# Patient Record
Sex: Female | Born: 1985 | Hispanic: No | Marital: Married | Smoking: Never smoker
Health system: Southern US, Community
[De-identification: ages and names within clinical notes are randomized; demographics above are authoritative.]

## PROBLEM LIST (undated history)

## (undated) ENCOUNTER — Inpatient Hospital Stay (HOSPITAL_COMMUNITY): Payer: Self-pay

## (undated) DIAGNOSIS — R51 Headache: Secondary | ICD-10-CM

## (undated) HISTORY — PX: NO PAST SURGERIES: SHX2092

---

## 2010-12-09 ENCOUNTER — Encounter
Admission: RE | Admit: 2010-12-09 | Discharge: 2010-12-09 | Payer: Self-pay | Source: Home / Self Care | Attending: Family Medicine | Admitting: Family Medicine

## 2010-12-28 ENCOUNTER — Encounter: Payer: Self-pay | Admitting: Family Medicine

## 2012-11-27 NOTE — L&D Delivery Note (Signed)
Delivery Note Pt progressed very rapidly w/ stronge urge to push, and after very brief 2nd stage at 12:49 AM a viable female was delivered via Vaginal, Spontaneous Delivery (Presentation: LOA ) delivered through loose nuchal and body cord.  APGAR: 7, 9; weight: not available at time of note  .   Placenta status: Intact, Spontaneous, marginal cord insertion, small, to Pathology.  Cord: 3 vessels with the following complications: None.  Cord pH: not done  Anesthesia: Epidural  Episiotomy: None Lacerations: 2nd degree;Perineal Suture Repair: 3.0 monocryl Est. Blood Loss (mL): 350  Mom to postpartum.  Baby to nursery-stable. Breastfeeding, nexplanon for contraception, OP circumcision  Marge Duncans 06/01/2013, 1:51 AM

## 2012-12-19 ENCOUNTER — Encounter (HOSPITAL_COMMUNITY): Payer: Self-pay

## 2012-12-19 ENCOUNTER — Inpatient Hospital Stay (HOSPITAL_COMMUNITY)
Admission: AD | Admit: 2012-12-19 | Discharge: 2012-12-19 | Disposition: A | Discharge status: Home / Self Care | Payer: Medicaid Other | Source: Ambulatory Visit | Attending: Obstetrics & Gynecology | Admitting: Obstetrics & Gynecology

## 2012-12-19 ENCOUNTER — Inpatient Hospital Stay (HOSPITAL_COMMUNITY): Payer: Medicaid Other

## 2012-12-19 DIAGNOSIS — O209 Hemorrhage in early pregnancy, unspecified: Secondary | ICD-10-CM | POA: Insufficient documentation

## 2012-12-19 DIAGNOSIS — O468X9 Other antepartum hemorrhage, unspecified trimester: Secondary | ICD-10-CM

## 2012-12-19 DIAGNOSIS — N390 Urinary tract infection, site not specified: Secondary | ICD-10-CM | POA: Insufficient documentation

## 2012-12-19 DIAGNOSIS — O418X90 Other specified disorders of amniotic fluid and membranes, unspecified trimester, not applicable or unspecified: Secondary | ICD-10-CM

## 2012-12-19 DIAGNOSIS — R109 Unspecified abdominal pain: Secondary | ICD-10-CM | POA: Insufficient documentation

## 2012-12-19 DIAGNOSIS — O2390 Unspecified genitourinary tract infection in pregnancy, unspecified trimester: Secondary | ICD-10-CM | POA: Insufficient documentation

## 2012-12-19 HISTORY — DX: Headache: R51

## 2012-12-19 LAB — URINE MICROSCOPIC-ADD ON

## 2012-12-19 LAB — WET PREP, GENITAL: Trich, Wet Prep: NONE SEEN

## 2012-12-19 LAB — URINALYSIS, ROUTINE W REFLEX MICROSCOPIC
Bilirubin Urine: NEGATIVE
Protein, ur: 100 mg/dL — AB
Specific Gravity, Urine: >1.03 — ABNORMAL HIGH (ref 1.005–1.030)
Urobilinogen, UA: 0.2 mg/dL (ref 0.0–1.0)

## 2012-12-19 MED ORDER — ACETAMINOPHEN 325 MG PO TABS
650.0000 mg | ORAL_TABLET | Freq: Once | ORAL | Status: AC
  Administered 2012-12-19: 650 mg via ORAL
  Filled 2012-12-19: qty 2

## 2012-12-19 NOTE — MAU Note (Signed)
Patient states she had sudden onset of abdominal pain with light spotting about 40 minutes ago. Slight nausea.

## 2012-12-19 NOTE — MAU Provider Note (Signed)
History     CSN: [id number]  Arrival date and time: 12/19/12 1606   First Provider Initiated Contact with Patient 12/19/12 1808      Chief Complaint  Patient presents with  . Abdominal Pain  . Vaginal Discharge   HPI Ms. Andrea [name] is a 27 y.o. G1P0 at [redacted]w[redacted]d who presents to MAU today with complaint of sudden onset abdominal pain and vaginal bleeding. The patient states that this started around 3:30 pm today. She has not taken any medication for the pain. She rates it at 7/10 now. The patient states a moderate amount of vaginal bleeding similar to a period. She denies N/V/D, fever, abnormal discharge or LOF. The patient has not had prenatal care and plans to start with [facility] when her Medicaid is approved. The patient denies urinary symptoms. LMP approximately 08/24/12.   OB History    Grav Para Term Preterm Abortions TAB SAB Ect Mult Living   1               Past Medical History  Diagnosis Date  . Headache     Past Surgical History  Procedure Date  . No past surgeries     History reviewed. No pertinent family history.  History  Substance Use Topics  . Smoking status: Never Smoker   . Smokeless tobacco: Not on file  . Alcohol Use: No    Allergies: No Known Allergies  No prescriptions prior to admission    ROS All negative unless otherwise noted in HPI Physical Exam   Blood pressure 111/71, pulse 85, temperature 98.1 F (36.7 C), temperature source Oral, resp. rate 18, height 4' 10.5" (1.486 m), weight 157 lb (71.215 kg), last menstrual period 08/24/2012, SpO2 100.00%.  Physical Exam  Constitutional: [name] oriented to person, place, and time. She appears well-developed and well-nourished. No distress.  HENT:  Head: Normocephalic and atraumatic.  Cardiovascular: Normal rate, regular rhythm and normal heart sounds.   Respiratory: Effort normal and breath sounds normal. No respiratory distress.  GI: Soft. Bowel sounds are normal. She exhibits no  distension and no mass. There is tenderness (moderate diffuse tenderness to palpation). There is no rebound and no guarding.  Genitourinary: Vagina normal and uterus normal.       Limited exam secondary to patient discomfort. Moderate amount of blood noted at the introitus. Unable to fully insert the speculum. Wet prep obtained. Unable to obtained GC/Chlamydia due to inability to view cervical os. Significant discomfort with bimanual exam. Patient was very nervous and unable to tolerate full exam.   Neurological: [name] alert and oriented to person, place, and time.  Skin: Skin is warm and dry. No erythema.  Psychiatric: She has a normal mood and affect.   Results for orders placed during the hospital encounter of 12/19/12 (from the past 24 hour(s))  URINALYSIS, ROUTINE W REFLEX MICROSCOPIC     Status: Abnormal   Collection Time   12/19/12  4:40 PM      Component Value Range   Color, Urine RED (*) YELLOW   APPearance TURBID (*) CLEAR   Specific Gravity, Urine >1.030 (*) 1.005 - 1.030   pH 5.5  5.0 - 8.0   Glucose, UA NEGATIVE  NEGATIVE mg/dL   Hgb urine dipstick LARGE (*) NEGATIVE   Bilirubin Urine NEGATIVE  NEGATIVE   Ketones, ur NEGATIVE  NEGATIVE mg/dL   Protein, ur 528 (*) NEGATIVE mg/dL   Urobilinogen, UA 0.2  0.0 - 1.0 mg/dL   Nitrite  POSITIVE (*) NEGATIVE   Leukocytes, UA TRACE (*) NEGATIVE  URINE MICROSCOPIC-ADD ON     Status: Abnormal   Collection Time   12/19/12  4:40 PM      Component Value Range   Squamous Epithelial / LPF RARE  RARE   WBC, UA 11-20  <3 WBC/hpf   RBC / HPF TOO NUMEROUS TO COUNT  <3 RBC/hpf   Bacteria, UA FEW (*) RARE   Crystals CA OXALATE CRYSTALS (*) NEGATIVE  WET PREP, GENITAL     Status: Abnormal   Collection Time   12/19/12  6:35 PM      Component Value Range   Yeast Wet Prep HPF POC NONE SEEN  NONE SEEN   Trich, Wet Prep NONE SEEN  NONE SEEN   Clue Cells Wet Prep HPF POC NONE SEEN  NONE SEEN   WBC, Wet Prep HPF POC FEW (*) NONE SEEN   Radiology  called with verbal results. There was no official read prior to patient's discharge.  Per Dr. Gilford Rile with [facility] radiology: There is a ~ 3.5 cm irregular collection of blood at the anterior portion of the placenta. He feels that this represents a small subchorionic hemorrhage.  Cervix is closed MAU Course  Procedures None  MDM Discussed patient with Dr. Debroah Loop. He feels that since patient's pain is well-controlled at this time we can discharge home with bleeding precautions. Tylenol for pain, pelvic rest and starting prenatal care as soon as possible.   Assessment and Plan  A: Subchorionic hemorrhage UTI in pregnancy  P: Discharge home Rx for Keflex sent to patient's pharmacy. Patient had been discharge when results were reviewed. Will attempt to contact patient by certified letter as contact number given tonight is not in service.  Bleeding precautions discussed.  Patient encouraged to take tylenol for pain Patient encouraged to returned MAU if her bleeding is heavier, pain is uncontrolled by tylenol or she develops N/V or fever.  Patient was encouraged to start prenatal care as soon as possible.    Freddi Starr, PA-C 12/19/2012, 9:35 PM

## 2012-12-20 ENCOUNTER — Encounter (HOSPITAL_COMMUNITY): Payer: Self-pay | Admitting: *Deleted

## 2012-12-21 LAB — URINE CULTURE
Colony Count: NO GROWTH
Culture: NO GROWTH

## 2012-12-23 ENCOUNTER — Other Ambulatory Visit: Payer: Self-pay | Admitting: Advanced Practice Midwife

## 2012-12-25 ENCOUNTER — Telehealth: Payer: Self-pay | Admitting: *Deleted

## 2012-12-25 DIAGNOSIS — N390 Urinary tract infection, site not specified: Secondary | ICD-10-CM

## 2012-12-25 MED ORDER — CEPHALEXIN 500 MG PO CAPS: 500.0000 mg | ORAL_CAPSULE | Freq: Four times a day (QID) | ORAL | Status: DC

## 2012-12-25 NOTE — Telephone Encounter (Signed)
Patient husband called and stated that her received a letter stating that we need to go over lab results. Upon review of her chart I saw that Raynelle Fanning had made a note to order Keflex for a UTI. I spoke with Dr. Macon Large and she gave a verbal order for Keflex 500mg  four times a day x7days. Pt agrees and will pick up medicine at [facility].

## 2013-01-07 ENCOUNTER — Ambulatory Visit (HOSPITAL_COMMUNITY)
Admission: RE | Admit: 2013-01-07 | Discharge: 2013-01-07 | Disposition: A | Discharge status: Home / Self Care | Payer: Medicaid Other | Source: Ambulatory Visit | Attending: Obstetrics & Gynecology | Admitting: Obstetrics & Gynecology

## 2013-01-07 ENCOUNTER — Other Ambulatory Visit: Payer: Self-pay | Admitting: Obstetrics & Gynecology

## 2013-01-07 ENCOUNTER — Ambulatory Visit (INDEPENDENT_AMBULATORY_CARE_PROVIDER_SITE_OTHER): Payer: Self-pay | Admitting: Obstetrics & Gynecology

## 2013-01-07 ENCOUNTER — Encounter: Payer: Self-pay | Admitting: Obstetrics & Gynecology

## 2013-01-07 VITALS — BP 120/73 | Wt 159.9 lb

## 2013-01-07 DIAGNOSIS — Z3480 Encounter for supervision of other normal pregnancy, unspecified trimester: Secondary | ICD-10-CM

## 2013-01-07 DIAGNOSIS — N390 Urinary tract infection, site not specified: Secondary | ICD-10-CM

## 2013-01-07 DIAGNOSIS — O358XX0 Maternal care for other (suspected) fetal abnormality and damage, not applicable or unspecified: Secondary | ICD-10-CM | POA: Insufficient documentation

## 2013-01-07 DIAGNOSIS — O0930 Supervision of pregnancy with insufficient antenatal care, unspecified trimester: Secondary | ICD-10-CM | POA: Insufficient documentation

## 2013-01-07 DIAGNOSIS — N760 Acute vaginitis: Secondary | ICD-10-CM

## 2013-01-07 DIAGNOSIS — Z1389 Encounter for screening for other disorder: Secondary | ICD-10-CM | POA: Insufficient documentation

## 2013-01-07 DIAGNOSIS — O2340 Unspecified infection of urinary tract in pregnancy, unspecified trimester: Secondary | ICD-10-CM

## 2013-01-07 DIAGNOSIS — O2390 Unspecified genitourinary tract infection in pregnancy, unspecified trimester: Secondary | ICD-10-CM

## 2013-01-07 DIAGNOSIS — O23599 Infection of other part of genital tract in pregnancy, unspecified trimester: Secondary | ICD-10-CM

## 2013-01-07 DIAGNOSIS — Z363 Encounter for antenatal screening for malformations: Secondary | ICD-10-CM | POA: Insufficient documentation

## 2013-01-07 MED ORDER — FLUCONAZOLE 150 MG PO TABS: 150.0000 mg | ORAL_TABLET | Freq: Once | ORAL | Status: DC

## 2013-01-07 NOTE — Progress Notes (Signed)
Pulse: 94 Pt complains of headaches.

## 2013-01-07 NOTE — Progress Notes (Signed)
   Subjective:    Andrea [name] is a G1P0 [redacted]w[redacted]d being seen today for her first obstetrical visit.  Her obstetrical history is significant for vaginismus. Patient does not intend to breast feed. Pregnancy history fully reviewed.  Patient reports vaginal irritation.  Filed Vitals:   01/07/13 0914  BP: 120/73  Weight: 159 lb 14.4 oz (72.53 kg)    HISTORY: OB History   Grav Para Term Preterm Abortions TAB SAB Ect Mult Living   1              # Outc Date GA Lbr Len/2nd Wgt Sex Del Anes PTL Lv   1 CUR              Past Medical History  Diagnosis Date  . Headache   . Medical history non-contributory    Past Surgical History  Procedure Laterality Date  . No past surgeries     History reviewed. No pertinent family history.   Exam    Uterus:     Pelvic Exam:    Perineum: No Hemorrhoids   Vulva: normal   Vagina:  curdlike discharge; SEVER ANXIETY related to pelvic exam        Cervix: no cervical motion tenderness, nulliparous appearance and unsure if there will be endocervical cells on PAP. Had to pull speculum.    Adnexa: normal adnexa   Bony Pelvis: average  System: Breast:  Normal to palpation without dominant masses, very tender diffusely   Skin: normal coloration and turgor, no rashes    Neurologic: oriented, normal mood   Extremities: normal strength, tone, and muscle mass           Neck no masses   Cardiovascular: regular rate and rhythm   Respiratory:  appears well, vitals normal, no respiratory distress, acyanotic, normal RR, ear and throat exam is normal, neck free of mass or lymphadenopathy, chest clear, no wheezing, crepitations, rhonchi, normal symmetric air entry   Abdomen: soft, non-tender; bowel sounds normal; no masses,  no organomegaly and gravid          Assessment:    Pregnancy: G1P0 at 19week New OB visit Yeast vaginitis (recently had ATBX for UTI) Vaginismus (etiology unknown)     Plan:     Initial labs drawn. Prenatal  vitamins. Problem list reviewed and updated. Genetic Screening discussed First Screen: requested.  Ultrasound discussed; fetal survey: requested.  Follow up in 4 weeks. 60% of 50 min visit spent on counseling and coordination of care.  Diflucan 150mg  po q day x 1 and repeat in 3 days     HARRAWAY-SMITH, Akeylah Hendel 01/07/2013

## 2013-01-07 NOTE — Patient Instructions (Addendum)
Pregnancy - Second Trimester The second trimester of pregnancy (3 to 6 months) is a period of rapid growth for you and your baby. At the end of the sixth month, your baby is about 9 inches long and weighs 1 1/2 pounds. You will begin to feel the baby move between 18 and 20 weeks of the pregnancy. This is called quickening. Weight gain is faster. A clear fluid (colostrum) may leak out of your breasts. You may feel small contractions of the womb (uterus). This is known as false labor or Braxton-Hicks contractions. This is like a practice for labor when the baby is ready to be born. Usually, the problems with morning sickness have usually passed by the end of your first trimester. Some women develop small dark blotches (called cholasma, mask of pregnancy) on their face that usually goes away after the baby is born. Exposure to the sun makes the blotches worse. Acne may also develop in some pregnant women and pregnant women who have acne, may find that it goes away. PRENATAL EXAMS  Blood work may continue to be done during prenatal exams. These tests are done to check on your health and the probable health of your baby. Blood work is used to follow your blood levels (hemoglobin). Anemia (low hemoglobin) is common during pregnancy. Iron and vitamins are given to help prevent this. You will also be checked for diabetes between 24 and 28 weeks of the pregnancy. Some of the previous blood tests may be repeated.  The size of the uterus is measured during each visit. This is to make sure that the baby is continuing to grow properly according to the dates of the pregnancy.  Your blood pressure is checked every prenatal visit. This is to make sure you are not getting toxemia.  Your urine is checked to make sure you do not have an infection, diabetes or protein in the urine.  Your weight is checked often to make sure gains are happening at the suggested rate. This is to ensure that both you and your baby are growing  normally.  Sometimes, an ultrasound is performed to confirm the proper growth and development of the baby. This is a test which bounces harmless sound waves off the baby so your caregiver can more accurately determine due dates. Sometimes, a specialized test is done on the amniotic fluid surrounding the baby. This test is called an amniocentesis. The amniotic fluid is obtained by sticking a needle into the belly (abdomen). This is done to check the chromosomes in instances where there is a concern about possible genetic problems with the baby. It is also sometimes done near the end of pregnancy if an early delivery is required. In this case, it is done to help make sure the baby's lungs are mature enough for the baby to live outside of the womb. CHANGES OCCURING IN THE SECOND TRIMESTER OF PREGNANCY Your body goes through many changes during pregnancy. They vary from person to person. Talk to your caregiver about changes you notice that you are concerned about.  During the second trimester, you will likely have an increase in your appetite. It is normal to have cravings for certain foods. This varies from person to person and pregnancy to pregnancy.  Your lower abdomen will begin to bulge.  You may have to urinate more often because the uterus and baby are pressing on your bladder. It is also common to get more bladder infections during pregnancy (pain with urination). You can help this by   drinking lots of fluids and emptying your bladder before and after intercourse.  You may begin to get stretch marks on your hips, abdomen, and breasts. These are normal changes in the body during pregnancy. There are no exercises or medications to take that prevent this change.  You may begin to develop swollen and bulging veins (varicose veins) in your legs. Wearing support hose, elevating your feet for 15 minutes, 3 to 4 times a day and limiting salt in your diet helps lessen the problem.  Heartburn may develop  as the uterus grows and pushes up against the stomach. Antacids recommended by your caregiver helps with this problem. Also, eating smaller meals 4 to 5 times a day helps.  Constipation can be treated with a stool softener or adding bulk to your diet. Drinking lots of fluids, vegetables, fruits, and whole grains are helpful.  Exercising is also helpful. If you have been very active up until your pregnancy, most of these activities can be continued during your pregnancy. If you have been less active, it is helpful to start an exercise program such as walking.  Hemorrhoids (varicose veins in the rectum) may develop at the end of the second trimester. Warm sitz baths and hemorrhoid cream recommended by your caregiver helps hemorrhoid problems.  Backaches may develop during this time of your pregnancy. Avoid heavy lifting, wear low heal shoes and practice good posture to help with backache problems.  Some pregnant women develop tingling and numbness of their hand and fingers because of swelling and tightening of ligaments in the wrist (carpel tunnel syndrome). This goes away after the baby is born.  As your breasts enlarge, you may have to get a bigger bra. Get a comfortable, cotton, support bra. Do not get a nursing bra until the last month of the pregnancy if you will be nursing the baby.  You may get a dark line from your belly button to the pubic area called the linea nigra.  You may develop rosy cheeks because of increase blood flow to the face.  You may develop spider looking lines of the face, neck, arms and chest. These go away after the baby is born. HOME CARE INSTRUCTIONS   It is extremely important to avoid all smoking, herbs, alcohol, and unprescribed drugs during your pregnancy. These chemicals affect the formation and growth of the baby. Avoid these chemicals throughout the pregnancy to ensure the delivery of a healthy infant.  Most of your home care instructions are the same as  suggested for the first trimester of your pregnancy. Keep your caregiver's appointments. Follow your caregiver's instructions regarding medication use, exercise and diet.  During pregnancy, you are providing food for you and your baby. Continue to eat regular, well-balanced meals. Choose foods such as meat, fish, milk and other low fat dairy products, vegetables, fruits, and whole-grain breads and cereals. Your caregiver will tell you of the ideal weight gain.  A physical sexual relationship may be continued up until near the end of pregnancy if there are no other problems. Problems could include early (premature) leaking of amniotic fluid from the membranes, vaginal bleeding, abdominal pain, or other medical or pregnancy problems.  Exercise regularly if there are no restrictions. Check with your caregiver if you are unsure of the safety of some of your exercises. The greatest weight gain will occur in the last 2 trimesters of pregnancy. Exercise will help you:  Control your weight.  Get you in shape for labor and delivery.  Lose weight   after you have the baby.  Wear a good support or jogging bra for breast tenderness during pregnancy. This may help if worn during sleep. Pads or tissues may be used in the bra if you are leaking colostrum.  Do not use hot tubs, steam rooms or saunas throughout the pregnancy.  Wear your seat belt at all times when driving. This protects you and your baby if you are in an accident.  Avoid raw meat, uncooked cheese, cat litter boxes and soil used by cats. These carry germs that can cause birth defects in the baby.  The second trimester is also a good time to visit your dentist for your dental health if this has not been done yet. Getting your teeth cleaned is OK. Use a soft toothbrush. Brush gently during pregnancy.  It is easier to loose urine during pregnancy. Tightening up and strengthening the pelvic muscles will help with this problem. Practice stopping your  urination while you are going to the bathroom. These are the same muscles you need to strengthen. It is also the muscles you would use as if you were trying to stop from passing gas. You can practice tightening these muscles up 10 times a set and repeating this about 3 times per day. Once you know what muscles to tighten up, do not perform these exercises during urination. It is more likely to contribute to an infection by backing up the urine.  Ask for help if you have financial, counseling or nutritional needs during pregnancy. Your caregiver will be able to offer counseling for these needs as well as refer you for other special needs.  Your skin may become oily. If so, wash your face with mild soap, use non-greasy moisturizer and oil or cream based makeup. MEDICATIONS AND DRUG USE IN PREGNANCY  Take prenatal vitamins as directed. The vitamin should contain 1 milligram of folic acid. Keep all vitamins out of reach of children. Only a couple vitamins or tablets containing iron may be fatal to a baby or young child when ingested.  Avoid use of all medications, including herbs, over-the-counter medications, not prescribed or suggested by your caregiver. Only take over-the-counter or prescription medicines for pain, discomfort, or fever as directed by your caregiver. Do not use aspirin.  Let your caregiver also know about herbs you may be using.  Alcohol is related to a number of birth defects. This includes fetal alcohol syndrome. All alcohol, in any form, should be avoided completely. Smoking will cause low birth rate and premature babies.  Street or illegal drugs are very harmful to the baby. They are absolutely forbidden. A baby born to an addicted mother will be addicted at birth. The baby will go through the same withdrawal an adult does. SEEK MEDICAL CARE IF:  You have any concerns or worries during your pregnancy. It is better to call with your questions if you feel they cannot wait, rather  than worry about them. SEEK IMMEDIATE MEDICAL CARE IF:   An unexplained oral temperature above 102 F (38.9 C) develops, or as your caregiver suggests.  You have leaking of fluid from the vagina (birth canal). If leaking membranes are suspected, take your temperature and tell your caregiver of this when you call.  There is vaginal spotting, bleeding, or passing clots. Tell your caregiver of the amount and how many pads are used. Light spotting in pregnancy is common, especially following intercourse.  You develop a bad smelling vaginal discharge with a change in the color from clear   to white.  You continue to feel sick to your stomach (nauseated) and have no relief from remedies suggested. You vomit blood or coffee ground-like materials.  You lose more than 2 pounds of weight or gain more than 2 pounds of weight over 1 week, or as suggested by your caregiver.  You notice swelling of your face, hands, feet, or legs.  You get exposed to [place] measles and have never had them.  You are exposed to fifth disease or chickenpox.  You develop belly (abdominal) pain. Round ligament discomfort is a common non-cancerous (benign) cause of abdominal pain in pregnancy. Your caregiver still must evaluate you.  You develop a bad headache that does not go away.  You develop fever, diarrhea, pain with urination, or shortness of breath.  You develop visual problems, blurry, or double vision.  You fall or are in a car accident or any kind of trauma.  There is mental or physical violence at home. Document Released: 11/07/2001 Document Revised: 02/05/2012 Document Reviewed: 05/12/2009 [facility] [employer] [facility] Patient Information 2013 [place], [place]. Monilial Vaginitis Vaginitis in a soreness, swelling and redness (inflammation) of the vagina and vulva. Monilial vaginitis is not a sexually transmitted infection. CAUSES  Yeast vaginitis is caused by yeast (candida) that is normally found in your vagina. With a yeast  infection, the candida has overgrown in number to a point that upsets the chemical balance. SYMPTOMS   White, thick vaginal discharge.  Swelling, itching, redness and irritation of the vagina and possibly the lips of the vagina (vulva).  Burning or painful urination.  Painful intercourse. DIAGNOSIS  Things that may contribute to monilial vaginitis are:  Postmenopausal and virginal states.  Pregnancy.  Infections.  Being tired, sick or stressed, especially if you had monilial vaginitis in the past.  Diabetes. Good control will help lower the chance.  Birth control pills.  Tight fitting garments.  Using bubble bath, feminine sprays, douches or deodorant tampons.  Taking certain medications that kill germs (antibiotics).  Sporadic recurrence can occur if you become ill. TREATMENT  Your caregiver will give you medication.  There are several kinds of anti monilial vaginal creams and suppositories specific for monilial vaginitis. For recurrent yeast infections, use a suppository or cream in the vagina 2 times a week, or as directed.  Anti-monilial or steroid cream for the itching or irritation of the vulva may also be used. Get your caregiver's permission.  Painting the vagina with methylene blue solution may help if the monilial cream does not work.  Eating yogurt may help prevent monilial vaginitis. HOME CARE INSTRUCTIONS   Finish all medication as prescribed.  Do not have sex until treatment is completed or after your caregiver tells you it is okay.  Take warm sitz baths.  Do not douche.  Do not use tampons, especially scented ones.  Wear cotton underwear.  Avoid tight pants and panty hose.  Tell your sexual partner that you have a yeast infection. They should go to their caregiver if they have symptoms such as mild rash or itching.  Your sexual partner should be treated as well if your infection is difficult to eliminate.  Practice safer sex. Use  condoms.  Some vaginal medications cause latex condoms to fail. Vaginal medications that harm condoms are:  Cleocin cream.  Butoconazole (Femstat).  Terconazole (Terazol) vaginal suppository.  Miconazole (Monistat) (may be purchased over the counter). SEEK MEDICAL CARE IF:   You have a temperature by mouth above 102 F (38.9 C).  The infection is getting  worse after 2 days of treatment.  The infection is not getting better after 3 days of treatment.  You develop blisters in or around your vagina.  You develop vaginal bleeding, and it is not your menstrual period.  You have pain when you urinate.  You develop intestinal problems.  You have pain with sexual intercourse. Document Released: 08/23/2005 Document Revised: 02/05/2012 Document Reviewed: 05/07/2009 [facility] Patient Information 2013 [place], [place].

## 2013-01-08 DIAGNOSIS — O43109 Malformation of placenta, unspecified, unspecified trimester: Secondary | ICD-10-CM | POA: Insufficient documentation

## 2013-01-08 LAB — OBSTETRIC PANEL
Hepatitis B Surface Ag: NEGATIVE
Lymphocytes Relative: 34 % (ref 12–46)
Lymphs Abs: 1.6 10*3/uL (ref 0.7–4.0)
MCV: 84.2 fL (ref 78.0–100.0)
Neutro Abs: 2.7 10*3/uL (ref 1.7–7.7)
Neutrophils Relative %: 56 % (ref 43–77)
Platelets: 214 10*3/uL (ref 150–400)
RBC: 4.38 MIL/uL (ref 3.87–5.11)
Rubella: 2.28 Index — ABNORMAL HIGH (ref <0.90)
WBC: 4.7 10*3/uL (ref 4.0–10.5)

## 2013-01-09 LAB — CULTURE, OB URINE

## 2013-01-10 LAB — HEMOGLOBINOPATHY EVALUATION
Hemoglobin Other: 0 %
Hgb F Quant: 0 % (ref 0.0–2.0)
Hgb S Quant: 0 %

## 2013-01-22 ENCOUNTER — Encounter: Payer: Self-pay | Admitting: *Deleted

## 2013-02-04 ENCOUNTER — Ambulatory Visit (INDEPENDENT_AMBULATORY_CARE_PROVIDER_SITE_OTHER): Payer: Medicaid Other | Admitting: Obstetrics & Gynecology

## 2013-02-04 ENCOUNTER — Other Ambulatory Visit: Payer: Self-pay | Admitting: Obstetrics & Gynecology

## 2013-02-04 VITALS — BP 120/74 | Temp 97.1°F | Wt 163.5 lb

## 2013-02-04 DIAGNOSIS — Z3481 Encounter for supervision of other normal pregnancy, first trimester: Secondary | ICD-10-CM

## 2013-02-04 DIAGNOSIS — Z3480 Encounter for supervision of other normal pregnancy, unspecified trimester: Secondary | ICD-10-CM

## 2013-02-04 LAB — POCT URINALYSIS DIP (DEVICE)
Glucose, UA: NEGATIVE mg/dL
Nitrite: NEGATIVE
Protein, ur: NEGATIVE mg/dL
Urobilinogen, UA: 0.2 mg/dL (ref 0.0–1.0)

## 2013-02-04 NOTE — Progress Notes (Signed)
Pulse- 94 

## 2013-02-04 NOTE — Patient Instructions (Addendum)
Pregnancy - Second Trimester The second trimester of pregnancy (3 to 6 months) is a period of rapid growth for you and your baby. At the end of the sixth month, your baby is about 9 inches long and weighs 1 1/2 pounds. You will begin to feel the baby move between 18 and 20 weeks of the pregnancy. This is called quickening. Weight gain is faster. A clear fluid (colostrum) may leak out of your breasts. You may feel small contractions of the womb (uterus). This is known as false labor or Braxton-Hicks contractions. This is like a practice for labor when the baby is ready to be born. Usually, the problems with morning sickness have usually passed by the end of your first trimester. Some women develop small dark blotches (called cholasma, mask of pregnancy) on their face that usually goes away after the baby is born. Exposure to the sun makes the blotches worse. Acne may also develop in some pregnant women and pregnant women who have acne, may find that it goes away. PRENATAL EXAMS  Blood work may continue to be done during prenatal exams. These tests are done to check on your health and the probable health of your baby. Blood work is used to follow your blood levels (hemoglobin). Anemia (low hemoglobin) is common during pregnancy. Iron and vitamins are given to help prevent this. You will also be checked for diabetes between 24 and 28 weeks of the pregnancy. Some of the previous blood tests may be repeated.  The size of the uterus is measured during each visit. This is to make sure that the baby is continuing to grow properly according to the dates of the pregnancy.  Your blood pressure is checked every prenatal visit. This is to make sure you are not getting toxemia.  Your urine is checked to make sure you do not have an infection, diabetes or protein in the urine.  Your weight is checked often to make sure gains are happening at the suggested rate. This is to ensure that both you and your baby are growing  normally.  Sometimes, an ultrasound is performed to confirm the proper growth and development of the baby. This is a test which bounces harmless sound waves off the baby so your caregiver can more accurately determine due dates. Sometimes, a specialized test is done on the amniotic fluid surrounding the baby. This test is called an amniocentesis. The amniotic fluid is obtained by sticking a needle into the belly (abdomen). This is done to check the chromosomes in instances where there is a concern about possible genetic problems with the baby. It is also sometimes done near the end of pregnancy if an early delivery is required. In this case, it is done to help make sure the baby's lungs are mature enough for the baby to live outside of the womb. CHANGES OCCURING IN THE SECOND TRIMESTER OF PREGNANCY Your body goes through many changes during pregnancy. They vary from person to person. Talk to your caregiver about changes you notice that you are concerned about.  During the second trimester, you will likely have an increase in your appetite. It is normal to have cravings for certain foods. This varies from person to person and pregnancy to pregnancy.  Your lower abdomen will begin to bulge.  You may have to urinate more often because the uterus and baby are pressing on your bladder. It is also common to get more bladder infections during pregnancy (pain with urination). You can help this by   drinking lots of fluids and emptying your bladder before and after intercourse.  You may begin to get stretch marks on your hips, abdomen, and breasts. These are normal changes in the body during pregnancy. There are no exercises or medications to take that prevent this change.  You may begin to develop swollen and bulging veins (varicose veins) in your legs. Wearing support hose, elevating your feet for 15 minutes, 3 to 4 times a day and limiting salt in your diet helps lessen the problem.  Heartburn may develop  as the uterus grows and pushes up against the stomach. Antacids recommended by your caregiver helps with this problem. Also, eating smaller meals 4 to 5 times a day helps.  Constipation can be treated with a stool softener or adding bulk to your diet. Drinking lots of fluids, vegetables, fruits, and whole grains are helpful.  Exercising is also helpful. If you have been very active up until your pregnancy, most of these activities can be continued during your pregnancy. If you have been less active, it is helpful to start an exercise program such as walking.  Hemorrhoids (varicose veins in the rectum) may develop at the end of the second trimester. Warm sitz baths and hemorrhoid cream recommended by your caregiver helps hemorrhoid problems.  Backaches may develop during this time of your pregnancy. Avoid heavy lifting, wear low heal shoes and practice good posture to help with backache problems.  Some pregnant women develop tingling and numbness of their hand and fingers because of swelling and tightening of ligaments in the wrist (carpel tunnel syndrome). This goes away after the baby is born.  As your breasts enlarge, you may have to get a bigger bra. Get a comfortable, cotton, support bra. Do not get a nursing bra until the last month of the pregnancy if you will be nursing the baby.  You may get a dark line from your belly button to the pubic area called the linea nigra.  You may develop rosy cheeks because of increase blood flow to the face.  You may develop spider looking lines of the face, neck, arms and chest. These go away after the baby is born. HOME CARE INSTRUCTIONS   It is extremely important to avoid all smoking, herbs, alcohol, and unprescribed drugs during your pregnancy. These chemicals affect the formation and growth of the baby. Avoid these chemicals throughout the pregnancy to ensure the delivery of a healthy infant.  Most of your home care instructions are the same as  suggested for the first trimester of your pregnancy. Keep your caregiver's appointments. Follow your caregiver's instructions regarding medication use, exercise and diet.  During pregnancy, you are providing food for you and your baby. Continue to eat regular, well-balanced meals. Choose foods such as meat, fish, milk and other low fat dairy products, vegetables, fruits, and whole-grain breads and cereals. Your caregiver will tell you of the ideal weight gain.  A physical sexual relationship may be continued up until near the end of pregnancy if there are no other problems. Problems could include early (premature) leaking of amniotic fluid from the membranes, vaginal bleeding, abdominal pain, or other medical or pregnancy problems.  Exercise regularly if there are no restrictions. Check with your caregiver if you are unsure of the safety of some of your exercises. The greatest weight gain will occur in the last 2 trimesters of pregnancy. Exercise will help you:  Control your weight.  Get you in shape for labor and delivery.  Lose weight   after you have the baby.  Wear a good support or jogging bra for breast tenderness during pregnancy. This may help if worn during sleep. Pads or tissues may be used in the bra if you are leaking colostrum.  Do not use hot tubs, steam rooms or saunas throughout the pregnancy.  Wear your seat belt at all times when driving. This protects you and your baby if you are in an accident.  Avoid raw meat, uncooked cheese, cat litter boxes and soil used by cats. These carry germs that can cause birth defects in the baby.  The second trimester is also a good time to visit your dentist for your dental health if this has not been done yet. Getting your teeth cleaned is OK. Use a soft toothbrush. Brush gently during pregnancy.  It is easier to loose urine during pregnancy. Tightening up and strengthening the pelvic muscles will help with this problem. Practice stopping your  urination while you are going to the bathroom. These are the same muscles you need to strengthen. It is also the muscles you would use as if you were trying to stop from passing gas. You can practice tightening these muscles up 10 times a set and repeating this about 3 times per day. Once you know what muscles to tighten up, do not perform these exercises during urination. It is more likely to contribute to an infection by backing up the urine.  Ask for help if you have financial, counseling or nutritional needs during pregnancy. Your caregiver will be able to offer counseling for these needs as well as refer you for other special needs.  Your skin may become oily. If so, wash your face with mild soap, use non-greasy moisturizer and oil or cream based makeup. MEDICATIONS AND DRUG USE IN PREGNANCY  Take prenatal vitamins as directed. The vitamin should contain 1 milligram of folic acid. Keep all vitamins out of reach of children. Only a couple vitamins or tablets containing iron may be fatal to a baby or young child when ingested.  Avoid use of all medications, including herbs, over-the-counter medications, not prescribed or suggested by your caregiver. Only take over-the-counter or prescription medicines for pain, discomfort, or fever as directed by your caregiver. Do not use aspirin.  Let your caregiver also know about herbs you may be using.  Alcohol is related to a number of birth defects. This includes fetal alcohol syndrome. All alcohol, in any form, should be avoided completely. Smoking will cause low birth rate and premature babies.  Street or illegal drugs are very harmful to the baby. They are absolutely forbidden. A baby born to an addicted mother will be addicted at birth. The baby will go through the same withdrawal an adult does. SEEK MEDICAL CARE IF:  You have any concerns or worries during your pregnancy. It is better to call with your questions if you feel they cannot wait, rather  than worry about them. SEEK IMMEDIATE MEDICAL CARE IF:   An unexplained oral temperature above 102 F (38.9 C) develops, or as your caregiver suggests.  You have leaking of fluid from the vagina (birth canal). If leaking membranes are suspected, take your temperature and tell your caregiver of this when you call.  There is vaginal spotting, bleeding, or passing clots. Tell your caregiver of the amount and how many pads are used. Light spotting in pregnancy is common, especially following intercourse.  You develop a bad smelling vaginal discharge with a change in the color from clear   to white.  You continue to feel sick to your stomach (nauseated) and have no relief from remedies suggested. You vomit blood or coffee ground-like materials.  You lose more than 2 pounds of weight or gain more than 2 pounds of weight over 1 week, or as suggested by your caregiver.  You notice swelling of your face, hands, feet, or legs.  You get exposed to German measles and have never had them.  You are exposed to fifth disease or chickenpox.  You develop belly (abdominal) pain. Round ligament discomfort is a common non-cancerous (benign) cause of abdominal pain in pregnancy. Your caregiver still must evaluate you.  You develop a bad headache that does not go away.  You develop fever, diarrhea, pain with urination, or shortness of breath.  You develop visual problems, blurry, or double vision.  You fall or are in a car accident or any kind of trauma.  There is mental or physical violence at home. Document Released: 11/07/2001 Document Revised: 02/05/2012 Document Reviewed: 05/12/2009 [employer] Patient Information 2013 [employer].  

## 2013-02-04 NOTE — Addendum Note (Signed)
Addended by: Sherre Lain A on: 02/04/2013 12:50 PM   Modules accepted: Orders

## 2013-02-04 NOTE — Progress Notes (Signed)
Pt with no complaints.  Reviewed prenatal labs.

## 2013-02-06 LAB — CULTURE, OB URINE: Colony Count: >=100000

## 2013-03-04 ENCOUNTER — Other Ambulatory Visit: Payer: Self-pay | Admitting: Obstetrics & Gynecology

## 2013-03-04 ENCOUNTER — Ambulatory Visit (INDEPENDENT_AMBULATORY_CARE_PROVIDER_SITE_OTHER): Payer: Medicaid Other | Admitting: Obstetrics & Gynecology

## 2013-03-04 VITALS — BP 111/76 | Temp 97.8°F | Wt 169.1 lb

## 2013-03-04 DIAGNOSIS — IMO0002 Reserved for concepts with insufficient information to code with codable children: Secondary | ICD-10-CM

## 2013-03-04 DIAGNOSIS — Z23 Encounter for immunization: Secondary | ICD-10-CM

## 2013-03-04 DIAGNOSIS — Z3493 Encounter for supervision of normal pregnancy, unspecified, third trimester: Secondary | ICD-10-CM

## 2013-03-04 DIAGNOSIS — Z3480 Encounter for supervision of other normal pregnancy, unspecified trimester: Secondary | ICD-10-CM

## 2013-03-04 DIAGNOSIS — IMO0001 Reserved for inherently not codable concepts without codable children: Secondary | ICD-10-CM

## 2013-03-04 LAB — CBC
Hemoglobin: 12 g/dL (ref 12.0–15.0)
MCH: 29.2 pg (ref 26.0–34.0)
MCHC: 33.2 g/dL (ref 30.0–36.0)
MCV: 87.8 fL (ref 78.0–100.0)
RBC: 4.11 MIL/uL (ref 3.87–5.11)

## 2013-03-04 LAB — POCT URINALYSIS DIP (DEVICE)
Ketones, ur: NEGATIVE mg/dL
Nitrite: NEGATIVE
Protein, ur: NEGATIVE mg/dL
pH: 6 (ref 5.0–8.0)

## 2013-03-04 MED ORDER — TETANUS-DIPHTH-ACELL PERTUSSIS 5-2.5-18.5 LF-MCG/0.5 IM SUSP
0.5000 mL | Freq: Once | INTRAMUSCULAR | Status: AC
  Administered 2013-03-04: 0.5 mL via INTRAMUSCULAR

## 2013-03-04 NOTE — Progress Notes (Addendum)
Discussed marginal cord insertion, need for followup ultrasound after 28 weeks.  1 hr GTT and third trimester labs today.  Consented for TDaP vaccine, will receive today.  No other complaints or concerns.  Fetal movement and labor precautions reviewed.

## 2013-03-04 NOTE — Progress Notes (Signed)
Pulse-  99  Pain/pressure-  "when baby is moving"

## 2013-03-04 NOTE — Patient Instructions (Signed)
Return to clinic for any obstetric concerns or go to [facility] for evaluation  

## 2013-03-05 LAB — RPR

## 2013-03-06 ENCOUNTER — Encounter: Payer: Self-pay | Admitting: Obstetrics & Gynecology

## 2013-03-25 ENCOUNTER — Other Ambulatory Visit: Payer: Self-pay | Admitting: Obstetrics & Gynecology

## 2013-03-25 ENCOUNTER — Encounter: Payer: Self-pay | Admitting: Obstetrics & Gynecology

## 2013-03-25 ENCOUNTER — Ambulatory Visit (INDEPENDENT_AMBULATORY_CARE_PROVIDER_SITE_OTHER): Payer: Medicaid Other | Admitting: Obstetrics & Gynecology

## 2013-03-25 VITALS — BP 112/78 | Temp 97.1°F | Wt 172.6 lb

## 2013-03-25 DIAGNOSIS — O43899 Other placental disorders, unspecified trimester: Secondary | ICD-10-CM

## 2013-03-25 DIAGNOSIS — O43103 Malformation of placenta, unspecified, third trimester: Secondary | ICD-10-CM

## 2013-03-25 LAB — POCT URINALYSIS DIP (DEVICE)
Ketones, ur: NEGATIVE mg/dL
Protein, ur: NEGATIVE mg/dL
Specific Gravity, Urine: 1.015 (ref 1.005–1.030)
Urobilinogen, UA: 0.2 mg/dL (ref 0.0–1.0)
pH: 6.5 (ref 5.0–8.0)

## 2013-03-25 NOTE — Progress Notes (Signed)
Patient reports having a hard time breathing when she lays down.

## 2013-03-25 NOTE — Patient Instructions (Addendum)
Contraceptive Implant Information A contraceptive implant is a plastic rod that is inserted under the skin. It is usually inserted under the skin of your upper arm. It continually releases small amounts of progestin (synthetic progesterone) into the bloodstream. This prevents an egg from being released from the ovary. It also thickens the cervical mucus to prevent sperm from entering the cervix, and it thins the uterine lining to prevent a fertilized egg from attaching to the uterus. They can be effective for up to 3 years. Implants do not provide protection against sexually transmitted diseases (STDs).  The procedure to insert an implant usually takes about 10 minutes. There may be minor bruising, swelling, and discomfort at the insertion site for a couple days. The implant begins to work within the first day. Other contraceptive protection should be used for 2 weeks. Follow up with your caregiver to get rechecked as directed. Your caregiver will make sure you are a good candidate for the contraceptive implant. Discuss with your caregiver the possible side effects of the implant ADVANTAGES  It prevents pregnancy for up to 3 years.  It is easily reversible.  It is convenient.  The progestins may protect against uterine and ovarian cancer.  It can be used when breastfeeding.  It can be used by women who cannot take estrogen. DISADVANTAGES  You may have irregular or unplanned vaginal bleeding.  You may develop side effects, including headache, weight gain, acne, breast tenderness, or mood changes.  You may have tissue or nerve damage after insertion (rare).  It may be difficult and uncomfortable to remove.  Certain medications may interfere with the effectiveness of the implants. REMOVAL OF IMPLANT The implant should be removed in 3 years or as directed by your caregiver. The implants effect wears off in a few hours after removal. Your ability to get pregnant (fertility) is restored  within a couple of weeks. New implants can be inserted as soon as the old ones are removed if desired. DO NOT GET THE IMPLANT IF:   You are pregnant.  You have a history of breast cancer, osteoporosis, blood clots, heart disease, diabetes, high blood pressure, liver disease, tumors, or stroke.   You have undiagnosed vaginal bleeding.  You have overly sensitive to certain parts of the implant. Document Released: 11/02/2011 Document Revised: 02/05/2012 Document Reviewed: 11/02/2011 [employer] Patient Information 2013 [employer].  

## 2013-03-25 NOTE — Progress Notes (Signed)
Pt with no complaints.  Has marginal cord insertion- f/u sono ordered Desires Nexplanon for contraception F/u 2 weeks

## 2013-03-31 ENCOUNTER — Ambulatory Visit (HOSPITAL_COMMUNITY)
Admission: RE | Admit: 2013-03-31 | Discharge: 2013-03-31 | Disposition: A | Discharge status: Home / Self Care | Payer: Medicaid Other | Source: Ambulatory Visit | Attending: Obstetrics & Gynecology | Admitting: Obstetrics & Gynecology

## 2013-03-31 DIAGNOSIS — O43103 Malformation of placenta, unspecified, third trimester: Secondary | ICD-10-CM

## 2013-03-31 DIAGNOSIS — O0930 Supervision of pregnancy with insufficient antenatal care, unspecified trimester: Secondary | ICD-10-CM | POA: Insufficient documentation

## 2013-03-31 DIAGNOSIS — Z3689 Encounter for other specified antenatal screening: Secondary | ICD-10-CM | POA: Insufficient documentation

## 2013-04-09 ENCOUNTER — Encounter: Payer: Self-pay | Admitting: Advanced Practice Midwife

## 2013-04-09 ENCOUNTER — Ambulatory Visit (INDEPENDENT_AMBULATORY_CARE_PROVIDER_SITE_OTHER): Payer: Medicaid Other | Admitting: Advanced Practice Midwife

## 2013-04-09 VITALS — BP 97/64 | Temp 98.2°F | Wt 176.0 lb

## 2013-04-09 DIAGNOSIS — IMO0001 Reserved for inherently not codable concepts without codable children: Secondary | ICD-10-CM

## 2013-04-09 DIAGNOSIS — Z3493 Encounter for supervision of normal pregnancy, unspecified, third trimester: Secondary | ICD-10-CM

## 2013-04-09 LAB — POCT URINALYSIS DIP (DEVICE)
Nitrite: NEGATIVE
Protein, ur: 30 mg/dL — AB
Specific Gravity, Urine: 1.02 (ref 1.005–1.030)
Urobilinogen, UA: 0.2 mg/dL (ref 0.0–1.0)

## 2013-04-09 NOTE — Progress Notes (Signed)
No complaints today. Feeling well. Scheduled fu [place] for 04/14/13. Reviewed fetal kick counts.

## 2013-04-09 NOTE — Progress Notes (Signed)
P-85 

## 2013-04-14 ENCOUNTER — Other Ambulatory Visit: Payer: Self-pay | Admitting: Advanced Practice Midwife

## 2013-04-14 ENCOUNTER — Ambulatory Visit (HOSPITAL_COMMUNITY)
Admission: RE | Admit: 2013-04-14 | Discharge: 2013-04-14 | Disposition: A | Discharge status: Home / Self Care | Payer: Medicaid Other | Source: Ambulatory Visit | Attending: Advanced Practice Midwife | Admitting: Advanced Practice Midwife

## 2013-04-14 ENCOUNTER — Ambulatory Visit (HOSPITAL_COMMUNITY)
Admission: RE | Admit: 2013-04-14 | Discharge: 2013-04-14 | Disposition: A | Discharge status: Home / Self Care | Payer: Medicaid Other | Source: Ambulatory Visit | Attending: Obstetrics & Gynecology | Admitting: Obstetrics & Gynecology

## 2013-04-14 ENCOUNTER — Encounter: Payer: Self-pay | Admitting: Obstetrics & Gynecology

## 2013-04-14 ENCOUNTER — Other Ambulatory Visit: Payer: Self-pay | Admitting: Obstetrics & Gynecology

## 2013-04-14 DIAGNOSIS — Z3493 Encounter for supervision of normal pregnancy, unspecified, third trimester: Secondary | ICD-10-CM

## 2013-04-14 DIAGNOSIS — O43103 Malformation of placenta, unspecified, third trimester: Secondary | ICD-10-CM

## 2013-04-14 DIAGNOSIS — O365990 Maternal care for other known or suspected poor fetal growth, unspecified trimester, not applicable or unspecified: Secondary | ICD-10-CM | POA: Insufficient documentation

## 2013-04-14 DIAGNOSIS — Z3689 Encounter for other specified antenatal screening: Secondary | ICD-10-CM | POA: Insufficient documentation

## 2013-04-16 ENCOUNTER — Ambulatory Visit (INDEPENDENT_AMBULATORY_CARE_PROVIDER_SITE_OTHER): Payer: Medicaid Other | Admitting: *Deleted

## 2013-04-16 ENCOUNTER — Encounter: Payer: Self-pay | Admitting: *Deleted

## 2013-04-16 VITALS — BP 121/71 | Wt 176.8 lb

## 2013-04-16 DIAGNOSIS — O365931 Maternal care for other known or suspected poor fetal growth, third trimester, fetus 1: Secondary | ICD-10-CM

## 2013-04-16 DIAGNOSIS — O365990 Maternal care for other known or suspected poor fetal growth, unspecified trimester, not applicable or unspecified: Secondary | ICD-10-CM

## 2013-04-16 NOTE — Progress Notes (Signed)
P - 81 

## 2013-04-17 NOTE — Progress Notes (Signed)
NST 04/16/13 reactive

## 2013-04-18 ENCOUNTER — Ambulatory Visit (INDEPENDENT_AMBULATORY_CARE_PROVIDER_SITE_OTHER): Payer: Medicaid Other | Admitting: *Deleted

## 2013-04-18 VITALS — BP 115/63

## 2013-04-18 DIAGNOSIS — O365931 Maternal care for other known or suspected poor fetal growth, third trimester, fetus 1: Secondary | ICD-10-CM

## 2013-04-18 DIAGNOSIS — O365990 Maternal care for other known or suspected poor fetal growth, unspecified trimester, not applicable or unspecified: Secondary | ICD-10-CM

## 2013-04-18 NOTE — Progress Notes (Signed)
P-102 

## 2013-04-20 NOTE — Progress Notes (Signed)
5/23 NST reviewed and reactive 

## 2013-04-23 ENCOUNTER — Ambulatory Visit (INDEPENDENT_AMBULATORY_CARE_PROVIDER_SITE_OTHER): Payer: Medicaid Other | Admitting: Advanced Practice Midwife

## 2013-04-23 VITALS — BP 110/75 | Temp 99.5°F | Wt 177.0 lb

## 2013-04-23 DIAGNOSIS — O365990 Maternal care for other known or suspected poor fetal growth, unspecified trimester, not applicable or unspecified: Secondary | ICD-10-CM

## 2013-04-23 LAB — POCT URINALYSIS DIP (DEVICE)
Glucose, UA: NEGATIVE mg/dL
Nitrite: NEGATIVE
Urobilinogen, UA: 0.2 mg/dL (ref 0.0–1.0)
pH: 7 (ref 5.0–8.0)

## 2013-04-23 NOTE — Patient Instructions (Signed)
Pregnancy - Third Trimester  The third trimester begins at the 28th week of pregnancy and ends at birth. It is important to follow your doctor's instructions.  HOME CARE    Go to your doctor's visits.   Do not smoke.   Do not drink alcohol or use drugs.   Only take medicine as told by your doctor.   Take prenatal vitamins as told. The vitamin should contain 1 milligram of folic acid.   Exercise.   Eat healthy foods. Eat regular, well-balanced meals.   You can have sex (intercourse) if there are no other problems with the pregnancy.   Do not use hot tubs, steam rooms, or saunas.   Wear a seat belt while driving.   Avoid raw meat, uncooked cheese, and litter boxes and soil used by cats.   Rest with your legs raised (elevated).   Make a list of emergency phone numbers. Keep this list with you.   Arrange for help when you come back home after delivering the baby.   Make a trial run to the hospital.   Take prenatal classes.   Prepare the baby's nursery.   Do not travel out of the city. If you absolutely have to, get permission from your doctor first.   Wear flat shoes. Do not wear high heels.  GET HELP RIGHT AWAY IF:    You have a temperature by mouth above 102 F (38.9 C), not controlled by medicine.   You have not felt the baby move for more than 1 hour. If you think the baby is not moving as much as normal, eat something with sugar in it or lie down on your left side for an hour. The baby should move at least 4 to 5 times per hour.   Fluid is coming from the vagina.   Blood is coming from the vagina. Light spotting is common, especially after sex (intercourse).   You have belly (abdominal) pain.   You have a bad smelling fluid (discharge) coming from the vagina. The fluid changes from clear to white.   You still feel sick to your stomach (nauseous).   You throw up (vomit) for more than 24 hours.   You have the chills.   You have shortness of breath.   You have a burning feeling when you  pee (urinate).   You lose or gain more than 2 pounds (0.9 kilograms) of weight over a week, or as told by your doctor.   Your face, hands, feet, or legs get puffy (swell).   You have a bad headache that will not go away.   You start to have problems seeing (blurry or double vision).   You fall, are in a car accident, or have any kind of trauma.   There is mental or physical violence at home.   You have any concerns or worries during your pregnancy.  MAKE SURE YOU:    Understand these instructions.   Will watch your condition.   Will get help right away if you are not doing well or get worse.  Document Released: 02/07/2010 Document Revised: 02/05/2012 Document Reviewed: 02/07/2010  [employer] Patient Information 2014 [employer].

## 2013-04-23 NOTE — Progress Notes (Signed)
Pulse: 102

## 2013-04-23 NOTE — Progress Notes (Signed)
Well, no c/o. Awaiting NST and AFI today. Rev'd precautions.

## 2013-04-25 ENCOUNTER — Ambulatory Visit (INDEPENDENT_AMBULATORY_CARE_PROVIDER_SITE_OTHER): Payer: Medicaid Other | Admitting: *Deleted

## 2013-04-25 VITALS — BP 99/60

## 2013-04-25 DIAGNOSIS — O365931 Maternal care for other known or suspected poor fetal growth, third trimester, fetus 1: Secondary | ICD-10-CM

## 2013-04-25 DIAGNOSIS — O365990 Maternal care for other known or suspected poor fetal growth, unspecified trimester, not applicable or unspecified: Secondary | ICD-10-CM

## 2013-04-25 NOTE — Progress Notes (Signed)
P-103 

## 2013-04-25 NOTE — Progress Notes (Signed)
NST reviewed and reactive.  Alper Guilmette L. Harraway-Smith, M.D., FACOG    

## 2013-04-30 ENCOUNTER — Ambulatory Visit (INDEPENDENT_AMBULATORY_CARE_PROVIDER_SITE_OTHER): Payer: Medicaid Other | Admitting: Advanced Practice Midwife

## 2013-04-30 VITALS — BP 119/72 | Wt 181.1 lb

## 2013-04-30 DIAGNOSIS — O365990 Maternal care for other known or suspected poor fetal growth, unspecified trimester, not applicable or unspecified: Secondary | ICD-10-CM

## 2013-04-30 DIAGNOSIS — O365931 Maternal care for other known or suspected poor fetal growth, third trimester, fetus 1: Secondary | ICD-10-CM

## 2013-04-30 LAB — POCT URINALYSIS DIP (DEVICE)
Nitrite: NEGATIVE
Protein, ur: NEGATIVE mg/dL
Urobilinogen, UA: 0.2 mg/dL (ref 0.0–1.0)
pH: 6.5 (ref 5.0–8.0)

## 2013-04-30 NOTE — Progress Notes (Signed)
P = 99   [place] for growth scheduled next week

## 2013-04-30 NOTE — Progress Notes (Signed)
NST reactive today. Scheduled for ultrasound for next week.

## 2013-05-02 ENCOUNTER — Ambulatory Visit (INDEPENDENT_AMBULATORY_CARE_PROVIDER_SITE_OTHER): Payer: Medicaid Other | Admitting: *Deleted

## 2013-05-02 VITALS — BP 112/69

## 2013-05-02 DIAGNOSIS — O365931 Maternal care for other known or suspected poor fetal growth, third trimester, fetus 1: Secondary | ICD-10-CM

## 2013-05-02 DIAGNOSIS — O365990 Maternal care for other known or suspected poor fetal growth, unspecified trimester, not applicable or unspecified: Secondary | ICD-10-CM

## 2013-05-02 NOTE — Progress Notes (Signed)
P=99 

## 2013-05-02 NOTE — Progress Notes (Signed)
NST reviewed and reactive.  

## 2013-05-07 ENCOUNTER — Ambulatory Visit (INDEPENDENT_AMBULATORY_CARE_PROVIDER_SITE_OTHER): Payer: Medicaid Other | Admitting: Family

## 2013-05-07 ENCOUNTER — Other Ambulatory Visit: Payer: Medicaid Other

## 2013-05-07 ENCOUNTER — Ambulatory Visit (HOSPITAL_COMMUNITY)
Admission: RE | Admit: 2013-05-07 | Discharge: 2013-05-07 | Disposition: A | Discharge status: Home / Self Care | Payer: Medicaid Other | Source: Ambulatory Visit | Attending: Advanced Practice Midwife | Admitting: Advanced Practice Midwife

## 2013-05-07 VITALS — BP 109/71 | Wt 181.3 lb

## 2013-05-07 DIAGNOSIS — O365990 Maternal care for other known or suspected poor fetal growth, unspecified trimester, not applicable or unspecified: Secondary | ICD-10-CM

## 2013-05-07 DIAGNOSIS — O0930 Supervision of pregnancy with insufficient antenatal care, unspecified trimester: Secondary | ICD-10-CM | POA: Insufficient documentation

## 2013-05-07 DIAGNOSIS — O43899 Other placental disorders, unspecified trimester: Secondary | ICD-10-CM

## 2013-05-07 DIAGNOSIS — O4390 Unspecified placental disorder, unspecified trimester: Secondary | ICD-10-CM

## 2013-05-07 DIAGNOSIS — O365931 Maternal care for other known or suspected poor fetal growth, third trimester, fetus 1: Secondary | ICD-10-CM

## 2013-05-07 DIAGNOSIS — Z3493 Encounter for supervision of normal pregnancy, unspecified, third trimester: Secondary | ICD-10-CM

## 2013-05-07 DIAGNOSIS — R8271 Bacteriuria: Secondary | ICD-10-CM

## 2013-05-07 LAB — OB RESULTS CONSOLE GC/CHLAMYDIA
Chlamydia: NEGATIVE
Gonorrhea: NEGATIVE

## 2013-05-07 LAB — POCT URINALYSIS DIP (DEVICE)
Nitrite: NEGATIVE
Protein, ur: 30 mg/dL — AB
Urobilinogen, UA: 1 mg/dL (ref 0.0–1.0)

## 2013-05-07 LAB — OB RESULTS CONSOLE GBS: GBS: NEGATIVE

## 2013-05-07 MED ORDER — NITROFURANTOIN MONOHYD MACRO 100 MG PO CAPS: 100.0000 mg | ORAL_CAPSULE | Freq: Two times a day (BID) | ORAL | Status: DC

## 2013-05-07 NOTE — Progress Notes (Signed)
NST reviewed and reactive; irregular contractions; pt with extreme reaction with pelvic exam, denies hx of sexual abuse.  GBS, GC/CT collected.  Large leuks on urine, RX macrobid, urine culture sent.  Growth [place] today at 9:45.  Return for NST on Friday.

## 2013-05-07 NOTE — Progress Notes (Signed)
P = 98   [place] for growth today @ 0945

## 2013-05-07 NOTE — Addendum Note (Signed)
Addended by: Franchot Mimes on: 05/07/2013 10:16 AM   Modules accepted: Orders

## 2013-05-08 LAB — GC/CHLAMYDIA PROBE AMP: CT Probe RNA: NEGATIVE

## 2013-05-09 ENCOUNTER — Ambulatory Visit (INDEPENDENT_AMBULATORY_CARE_PROVIDER_SITE_OTHER): Payer: Medicaid Other | Admitting: *Deleted

## 2013-05-09 ENCOUNTER — Other Ambulatory Visit: Payer: Medicaid Other

## 2013-05-09 DIAGNOSIS — O365990 Maternal care for other known or suspected poor fetal growth, unspecified trimester, not applicable or unspecified: Secondary | ICD-10-CM

## 2013-05-09 DIAGNOSIS — O365931 Maternal care for other known or suspected poor fetal growth, third trimester, fetus 1: Secondary | ICD-10-CM

## 2013-05-10 ENCOUNTER — Encounter: Payer: Self-pay | Admitting: Family

## 2013-05-11 LAB — CULTURE, OB URINE: Colony Count: 80000

## 2013-05-11 NOTE — Progress Notes (Signed)
NST performed today was reviewed and was found to be reactive.  Continue recommended antenatal testing and prenatal care.  

## 2013-05-12 ENCOUNTER — Other Ambulatory Visit: Payer: Self-pay | Admitting: Family

## 2013-05-14 ENCOUNTER — Ambulatory Visit (INDEPENDENT_AMBULATORY_CARE_PROVIDER_SITE_OTHER): Payer: Medicaid Other | Admitting: Advanced Practice Midwife

## 2013-05-14 ENCOUNTER — Encounter: Payer: Self-pay | Admitting: *Deleted

## 2013-05-14 VITALS — BP 117/75 | Wt 182.2 lb

## 2013-05-14 DIAGNOSIS — O365931 Maternal care for other known or suspected poor fetal growth, third trimester, fetus 1: Secondary | ICD-10-CM

## 2013-05-14 DIAGNOSIS — O43899 Other placental disorders, unspecified trimester: Secondary | ICD-10-CM

## 2013-05-14 DIAGNOSIS — O368990 Maternal care for other specified fetal problems, unspecified trimester, not applicable or unspecified: Secondary | ICD-10-CM

## 2013-05-14 DIAGNOSIS — O365990 Maternal care for other known or suspected poor fetal growth, unspecified trimester, not applicable or unspecified: Secondary | ICD-10-CM

## 2013-05-14 DIAGNOSIS — O43103 Malformation of placenta, unspecified, third trimester: Secondary | ICD-10-CM

## 2013-05-14 DIAGNOSIS — O4390 Unspecified placental disorder, unspecified trimester: Secondary | ICD-10-CM

## 2013-05-14 LAB — POCT URINALYSIS DIP (DEVICE)
Bilirubin Urine: NEGATIVE
Ketones, ur: NEGATIVE mg/dL
Specific Gravity, Urine: 1.02 (ref 1.005–1.030)
pH: 6.5 (ref 5.0–8.0)

## 2013-05-14 NOTE — Progress Notes (Signed)
Feeling well. NST reactive. Continue with 2x weekly testing. D/W Particia Nearing, MD (MFM). OK to continue 2X weekly testing. Does not need IOL by 39 weeks, but plan for IOL by 40 weeks as long as testing remains ok. Does not need to have a repeat growth ultrasound prior to then. D/W family will schedule IOL for 05/31/13. Scheduled for 05/31/13 at 7:15pm for cervical ripening.

## 2013-05-14 NOTE — Progress Notes (Signed)
P = 94   Pt reports having some vomiting w/o nausea 3 times last week- seemed to happen just after eating. Advised to pt that she may take Zantac if desired.

## 2013-05-16 ENCOUNTER — Ambulatory Visit (INDEPENDENT_AMBULATORY_CARE_PROVIDER_SITE_OTHER): Payer: Medicaid Other | Admitting: *Deleted

## 2013-05-16 VITALS — BP 114/64 | Temp 98.8°F | Wt 184.3 lb

## 2013-05-16 DIAGNOSIS — O4390 Unspecified placental disorder, unspecified trimester: Secondary | ICD-10-CM

## 2013-05-16 DIAGNOSIS — O365990 Maternal care for other known or suspected poor fetal growth, unspecified trimester, not applicable or unspecified: Secondary | ICD-10-CM

## 2013-05-16 DIAGNOSIS — O365930 Maternal care for other known or suspected poor fetal growth, third trimester, not applicable or unspecified: Secondary | ICD-10-CM

## 2013-05-16 DIAGNOSIS — O43899 Other placental disorders, unspecified trimester: Secondary | ICD-10-CM

## 2013-05-16 NOTE — Progress Notes (Signed)
P=73, here for NSt

## 2013-05-19 ENCOUNTER — Ambulatory Visit (INDEPENDENT_AMBULATORY_CARE_PROVIDER_SITE_OTHER): Payer: Medicaid Other | Admitting: General Practice

## 2013-05-19 VITALS — BP 110/67 | Wt 185.3 lb

## 2013-05-19 DIAGNOSIS — O365931 Maternal care for other known or suspected poor fetal growth, third trimester, fetus 1: Secondary | ICD-10-CM

## 2013-05-19 DIAGNOSIS — O365990 Maternal care for other known or suspected poor fetal growth, unspecified trimester, not applicable or unspecified: Secondary | ICD-10-CM

## 2013-05-19 NOTE — Progress Notes (Signed)
Pulse: 92

## 2013-05-19 NOTE — Progress Notes (Signed)
NST reviewed and reactive.  

## 2013-05-21 ENCOUNTER — Ambulatory Visit (HOSPITAL_COMMUNITY)
Admission: RE | Admit: 2013-05-21 | Discharge: 2013-05-21 | Disposition: A | Discharge status: Home / Self Care | Payer: Medicaid Other | Source: Ambulatory Visit | Attending: Obstetrics & Gynecology | Admitting: Obstetrics & Gynecology

## 2013-05-21 ENCOUNTER — Ambulatory Visit (INDEPENDENT_AMBULATORY_CARE_PROVIDER_SITE_OTHER): Payer: Medicaid Other | Admitting: Family Medicine

## 2013-05-21 VITALS — BP 116/75 | Temp 96.9°F | Wt 186.0 lb

## 2013-05-21 DIAGNOSIS — O365931 Maternal care for other known or suspected poor fetal growth, third trimester, fetus 1: Secondary | ICD-10-CM

## 2013-05-21 DIAGNOSIS — O368990 Maternal care for other specified fetal problems, unspecified trimester, not applicable or unspecified: Secondary | ICD-10-CM

## 2013-05-21 DIAGNOSIS — O365990 Maternal care for other known or suspected poor fetal growth, unspecified trimester, not applicable or unspecified: Secondary | ICD-10-CM

## 2013-05-21 DIAGNOSIS — O43103 Malformation of placenta, unspecified, third trimester: Secondary | ICD-10-CM

## 2013-05-21 DIAGNOSIS — O365930 Maternal care for other known or suspected poor fetal growth, third trimester, not applicable or unspecified: Secondary | ICD-10-CM

## 2013-05-21 DIAGNOSIS — O0930 Supervision of pregnancy with insufficient antenatal care, unspecified trimester: Secondary | ICD-10-CM | POA: Insufficient documentation

## 2013-05-21 LAB — POCT URINALYSIS DIP (DEVICE)
Ketones, ur: NEGATIVE mg/dL
Protein, ur: NEGATIVE mg/dL
Specific Gravity, Urine: 1.015 (ref 1.005–1.030)

## 2013-05-21 NOTE — Progress Notes (Signed)
No complaints. NST today - reviewed, reactive. Scheduled for induction at 40 weeks. Labor precautions discussed.

## 2013-05-21 NOTE — Progress Notes (Signed)
Pulse 83  Edema trace in ankles.

## 2013-05-21 NOTE — Patient Instructions (Signed)
Vaginal Delivery  Your caregiver must first be sure you are in labor. Signs of labor include:   You may pass what is called "the mucus plug" before labor begins. This is a small amount of blood stained mucus.   Regular uterine contractions.   The time between contractions get closer together.   The discomfort and pain gradually gets more intense.   Pains are mostly located in the back.   Pains get worse when walking.   The cervix (the opening of the uterus) becomes thinner (begins to efface) and opens up (dilates).  Once you are in labor and admitted into the hospital or care center, your caregiver will do the following:   A complete physical examination.   Check your vital signs (blood pressure, pulse, temperature and the fetal heart rate).   Do a vaginal examination (using a sterile glove and lubricant) to determine:   The position (presentation) of the baby (head [vertex] or buttock first).   The level (station) of the baby's head in the birth canal.   The effacement and dilatation of the cervix.   You may have your pubic hair shaved and be given an enema depending on your caregiver and the circumstance.   An electronic monitor is usually placed on your abdomen. The monitor follows the length and intensity of the contractions, as well as the baby's heart rate.   Usually, your caregiver will insert an IV in your arm with a bottle of sugar water. This is done as a precaution so that medications can be given to you quickly during labor or delivery.  NORMAL LABOR AND DELIVERY IS DIVIDED UP INTO 3 STAGES:  First Stage  This is when regular contractions begin and the cervix begins to efface and dilate. This stage can last from 3 to 15 hours. The end of the first stage is when the cervix is 100% effaced and 10 centimeters dilated. Pain medications may be given by    Injection (morphine, demerol, etc.)    Regional anesthesia (spinal, caudal or epidural, anesthetics given in different locations of the spine). Paracervical pain medication may be given, which is an injection of and anesthetic on each side of the cervix.  A pregnant woman may request to have "Natural Childbirth" which is not to have any medications or anesthesia during her labor and delivery.  Second Stage  This is when the baby comes down through the birth canal (vagina) and is born. This can take 1 to 4 hours. As the baby's head comes down through the birth canal, you may feel like you are going to have a bowel movement. You will get the urge to bear down and push until the baby is delivered. As the baby's head is being delivered, the caregiver will decide if an episiotomy (a cut in the perineum and vagina area) is needed to prevent tearing of the tissue in this area. The episiotomy is sewn up after the delivery of the baby and placenta. Sometimes a mask with nitrous oxide is given for the mother to breath during the delivery of the baby to help if there is too much pain. The end of Stage 2 is when the baby is fully delivered. Then when the umbilical cord stops pulsating it is clamped and cut.  Third Stage  The third stage begins after the baby is completely delivered and ends after the placenta (afterbirth) is delivered. This usually takes 5 to 30 minutes. After the placenta is delivered, a medication is given   either by intravenous or injection to help contract the uterus and prevent bleeding. The third stage is not painful and pain medication is usually not necessary. If an episiotomy was done, it is repaired at this time.  After the delivery, the mother is watched and monitored closely for 1 to 2 hours to make sure there is no postpartum bleeding (hemorrhage). If there is a lot of bleeding, medication is given to contract the uterus and stop the bleeding.  Document Released: 08/22/2008 Document Revised: 08/07/2012 Document Reviewed: 08/22/2008   [employer] Patient Information 2014 [employer].

## 2013-05-26 ENCOUNTER — Ambulatory Visit (INDEPENDENT_AMBULATORY_CARE_PROVIDER_SITE_OTHER): Payer: Medicaid Other | Admitting: *Deleted

## 2013-05-26 ENCOUNTER — Telehealth (HOSPITAL_COMMUNITY): Payer: Self-pay | Admitting: *Deleted

## 2013-05-26 VITALS — BP 120/70 | Temp 98.3°F | Wt 185.3 lb

## 2013-05-26 DIAGNOSIS — O4390 Unspecified placental disorder, unspecified trimester: Secondary | ICD-10-CM

## 2013-05-26 DIAGNOSIS — O368990 Maternal care for other specified fetal problems, unspecified trimester, not applicable or unspecified: Secondary | ICD-10-CM

## 2013-05-26 DIAGNOSIS — O43899 Other placental disorders, unspecified trimester: Secondary | ICD-10-CM

## 2013-05-26 DIAGNOSIS — O365990 Maternal care for other known or suspected poor fetal growth, unspecified trimester, not applicable or unspecified: Secondary | ICD-10-CM

## 2013-05-26 DIAGNOSIS — O365931 Maternal care for other known or suspected poor fetal growth, third trimester, fetus 1: Secondary | ICD-10-CM

## 2013-05-26 DIAGNOSIS — O43103 Malformation of placenta, unspecified, third trimester: Secondary | ICD-10-CM

## 2013-05-26 NOTE — Telephone Encounter (Signed)
Preadmission screen  

## 2013-05-26 NOTE — Progress Notes (Signed)
P=81,    Here for nst

## 2013-05-27 NOTE — Progress Notes (Addendum)
NST performed today was reviewed and was found to be reactive, had one mild variable decelerations but was reactive afterwards.  Continue recommended antenatal testing and prenatal care.

## 2013-05-27 NOTE — Patient Instructions (Signed)
Return to clinic for any obstetric concerns or go to [facility] for evaluation  

## 2013-05-28 ENCOUNTER — Ambulatory Visit (INDEPENDENT_AMBULATORY_CARE_PROVIDER_SITE_OTHER): Payer: Medicaid Other | Admitting: Family

## 2013-05-28 VITALS — BP 109/76 | Wt 185.8 lb

## 2013-05-28 DIAGNOSIS — O365931 Maternal care for other known or suspected poor fetal growth, third trimester, fetus 1: Secondary | ICD-10-CM

## 2013-05-28 DIAGNOSIS — O365990 Maternal care for other known or suspected poor fetal growth, unspecified trimester, not applicable or unspecified: Secondary | ICD-10-CM

## 2013-05-28 LAB — POCT URINALYSIS DIP (DEVICE)
Ketones, ur: NEGATIVE mg/dL
Protein, ur: 30 mg/dL — AB
Specific Gravity, Urine: 1.02 (ref 1.005–1.030)

## 2013-05-28 MED ORDER — AMPICILLIN 500 MG PO CAPS: 500.0000 mg | ORAL_CAPSULE | Freq: Three times a day (TID) | ORAL | Status: DC

## 2013-05-28 NOTE — Progress Notes (Signed)
Pulse: 81

## 2013-05-28 NOTE — Progress Notes (Signed)
IOL scheduled on 05/31/13 @ 1930

## 2013-05-28 NOTE — Progress Notes (Signed)
No questions or concerns; IOL on 05/31/13 @ 1930;  NST reactive; taking tour of hospital tomorrow night; large leuks in urine, asymptomatic, begin Ampicillin.

## 2013-05-30 NOTE — Progress Notes (Signed)
NST reactive on 05/26/13.  One variable.

## 2013-05-31 ENCOUNTER — Inpatient Hospital Stay (HOSPITAL_COMMUNITY): Payer: Medicaid Other | Admitting: Anesthesiology

## 2013-05-31 ENCOUNTER — Encounter (HOSPITAL_COMMUNITY): Payer: Self-pay | Admitting: Anesthesiology

## 2013-05-31 ENCOUNTER — Encounter (HOSPITAL_COMMUNITY): Payer: Self-pay

## 2013-05-31 ENCOUNTER — Inpatient Hospital Stay (HOSPITAL_COMMUNITY)
Admission: RE | Admit: 2013-05-31 | Discharge: 2013-06-02 | DRG: 775 | Disposition: A | Discharge status: Home / Self Care | Payer: Medicaid Other | Source: Ambulatory Visit | Attending: Obstetrics & Gynecology | Admitting: Obstetrics & Gynecology

## 2013-05-31 VITALS — BP 126/75 | HR 100 | Temp 97.9°F | Resp 18 | Ht 58.5 in | Wt 185.0 lb

## 2013-05-31 DIAGNOSIS — O365990 Maternal care for other known or suspected poor fetal growth, unspecified trimester, not applicable or unspecified: Principal | ICD-10-CM | POA: Diagnosis present

## 2013-05-31 DIAGNOSIS — Z3493 Encounter for supervision of normal pregnancy, unspecified, third trimester: Secondary | ICD-10-CM

## 2013-05-31 DIAGNOSIS — O43103 Malformation of placenta, unspecified, third trimester: Secondary | ICD-10-CM

## 2013-05-31 LAB — TYPE AND SCREEN: ABO/RH(D): O POS

## 2013-05-31 LAB — CBC
MCH: 29.3 pg (ref 26.0–34.0)
MCHC: 34.2 g/dL (ref 30.0–36.0)
Platelets: 201 10*3/uL (ref 150–400)
RBC: 4.57 MIL/uL (ref 3.87–5.11)
RDW: 14.3 % (ref 11.5–15.5)

## 2013-05-31 MED ORDER — FLEET ENEMA 7-19 GM/118ML RE ENEM: 1.0000 | ENEMA | RECTAL | Status: DC | PRN

## 2013-05-31 MED ORDER — IBUPROFEN 600 MG PO TABS: 600.0000 mg | ORAL_TABLET | Freq: Four times a day (QID) | ORAL | Status: DC | PRN

## 2013-05-31 MED ORDER — LACTATED RINGERS IV SOLN: 500.0000 mL | Freq: Once | INTRAVENOUS | Status: DC

## 2013-05-31 MED ORDER — ACETAMINOPHEN 325 MG PO TABS: 650.0000 mg | ORAL_TABLET | ORAL | Status: DC | PRN

## 2013-05-31 MED ORDER — PHENYLEPHRINE 40 MCG/ML (10ML) SYRINGE FOR IV PUSH (FOR BLOOD PRESSURE SUPPORT): 80.0000 ug | PREFILLED_SYRINGE | INTRAVENOUS | Status: DC | PRN

## 2013-05-31 MED ORDER — FENTANYL 2.5 MCG/ML BUPIVACAINE 1/10 % EPIDURAL INFUSION (WH - ANES)
14.0000 mL/h | INTRAMUSCULAR | Status: DC | PRN
  Filled 2013-05-31: qty 125

## 2013-05-31 MED ORDER — FENTANYL 2.5 MCG/ML BUPIVACAINE 1/10 % EPIDURAL INFUSION (WH - ANES)
INTRAMUSCULAR | Status: DC | PRN
  Administered 2013-05-31: 14 mL/h via EPIDURAL

## 2013-05-31 MED ORDER — EPHEDRINE 5 MG/ML INJ
10.0000 mg | INTRAVENOUS | Status: DC | PRN
  Filled 2013-05-31: qty 4

## 2013-05-31 MED ORDER — OXYTOCIN 40 UNITS IN LACTATED RINGERS INFUSION - SIMPLE MED
1.0000 m[IU]/min | INTRAVENOUS | Status: DC
  Administered 2013-05-31: 2 m[IU]/min via INTRAVENOUS
  Filled 2013-05-31: qty 1000

## 2013-05-31 MED ORDER — PHENYLEPHRINE 40 MCG/ML (10ML) SYRINGE FOR IV PUSH (FOR BLOOD PRESSURE SUPPORT)
80.0000 ug | PREFILLED_SYRINGE | INTRAVENOUS | Status: DC | PRN
  Filled 2013-05-31: qty 5

## 2013-05-31 MED ORDER — OXYCODONE-ACETAMINOPHEN 5-325 MG PO TABS
1.0000 | ORAL_TABLET | ORAL | Status: DC | PRN
Start: 2013-05-31 — End: 2013-06-01

## 2013-05-31 MED ORDER — LIDOCAINE HCL (PF) 1 % IJ SOLN
30.0000 mL | INTRAMUSCULAR | Status: DC | PRN
  Filled 2013-05-31: qty 30

## 2013-05-31 MED ORDER — CITRIC ACID-SODIUM CITRATE 334-500 MG/5ML PO SOLN: 30.0000 mL | ORAL | Status: DC | PRN

## 2013-05-31 MED ORDER — EPHEDRINE 5 MG/ML INJ: 10.0000 mg | INTRAVENOUS | Status: DC | PRN

## 2013-05-31 MED ORDER — OXYTOCIN 40 UNITS IN LACTATED RINGERS INFUSION - SIMPLE MED: 62.5000 mL/h | INTRAVENOUS | Status: DC

## 2013-05-31 MED ORDER — DIPHENHYDRAMINE HCL 50 MG/ML IJ SOLN: 12.5000 mg | INTRAMUSCULAR | Status: DC | PRN

## 2013-05-31 MED ORDER — LIDOCAINE HCL (PF) 1 % IJ SOLN
INTRAMUSCULAR | Status: DC | PRN
  Administered 2013-05-31 (×2): 5 mL
  Administered 2013-05-31: 3 mL

## 2013-05-31 MED ORDER — TERBUTALINE SULFATE 1 MG/ML IJ SOLN: 0.2500 mg | Freq: Once | INTRAMUSCULAR | Status: AC | PRN

## 2013-05-31 MED ORDER — OXYTOCIN BOLUS FROM INFUSION: 500.0000 mL | INTRAVENOUS | Status: DC

## 2013-05-31 MED ORDER — ONDANSETRON HCL 4 MG/2ML IJ SOLN: 4.0000 mg | Freq: Four times a day (QID) | INTRAMUSCULAR | Status: DC | PRN

## 2013-05-31 MED ORDER — LACTATED RINGERS IV SOLN
INTRAVENOUS | Status: DC
  Administered 2013-05-31: 20:00:00 via INTRAVENOUS

## 2013-05-31 MED ORDER — HYDROXYZINE HCL 50 MG PO TABS: 50.0000 mg | ORAL_TABLET | Freq: Four times a day (QID) | ORAL | Status: DC | PRN

## 2013-05-31 MED ORDER — LACTATED RINGERS IV SOLN: 500.0000 mL | INTRAVENOUS | Status: DC | PRN

## 2013-05-31 NOTE — Anesthesia Procedure Notes (Signed)
Epidural Patient location during procedure: OB  Staffing Anesthesiologist: Thailyn Khalid Performed by: anesthesiologist   Preanesthetic Checklist Completed: patient identified, site marked, surgical consent, pre-op evaluation, timeout performed, IV checked, risks and benefits discussed and monitors and equipment checked  Epidural Patient position: sitting Prep: ChloraPrep Patient monitoring: heart rate, continuous pulse ox and blood pressure Approach: midline Injection technique: LOR saline  Needle:  Needle type: Tuohy  Needle gauge: 17 G Needle length: 9 cm and 9 Needle insertion depth: 7 cm Catheter type: closed end flexible Catheter size: 20 Guage Catheter at skin depth: 12 cm Test dose: negative  Assessment Events: blood not aspirated, injection not painful, no injection resistance, negative IV test and no paresthesia  Additional Notes   Patient tolerated the insertion well without complications.   

## 2013-05-31 NOTE — Anesthesia Preprocedure Evaluation (Signed)

## 2013-05-31 NOTE — H&P (Signed)
Andrea [name] is a 27 y.o. G1P0 female at [redacted]w[redacted]d by LMP which correlates exactly w/ 16.5wk u/s, presenting for IOL d/t SGA w/ marginal cord insertion. Pt initiated care at [facility] @ 19.3wks, quad screen normal, anatomy scan revealed marginal cord insertion w/ efw 33%, 1hr glucola 113, gbs neg. Repeat u/s: 5/5 @ 31.2wks: efw 27%, 5/19 @ 33.2wks efw 13% w/ normal dopplers, 6/11 @ 36.4wks 16%, afi has remained normal/stable throughout. Has been followed w/ 2x/wk nst's since ~32wks.  Reports uncomfortable uc's today, denies vb or lof. Good fm.   Maternal Medical History:  Reason for admission: IOL d/t marginal cord insertion w/ SGA  Contractions: Onset was 13-24 hours ago.   Frequency: irregular.   Perceived severity is moderate.    Fetal activity: Perceived fetal activity is normal.   Last perceived fetal movement was within the past hour.    Prenatal complications: no prenatal complications Prenatal Complications - Diabetes: none.    OB History   Grav Para Term Preterm Abortions TAB SAB Ect Mult Living   1              Past Medical History  Diagnosis Date  . Headache(784.0)   . Medical history non-contributory    Past Surgical History  Procedure Laterality Date  . No past surgeries     Family History: family history is not on file. Social History:  reports that she has never smoked. She does not have any smokeless tobacco history on file. She reports that she does not drink alcohol or use illicit drugs.   Review of Systems  Constitutional: Negative.   HENT: Negative.   Eyes: Negative.   Respiratory: Negative.   Cardiovascular: Negative.   Gastrointestinal: Positive for abdominal pain (uc's).  Genitourinary: Negative.   Musculoskeletal: Negative.   Skin: Negative.   Neurological: Negative.   Endo/Heme/Allergies: Negative.   Psychiatric/Behavioral: Negative.     Dilation: 4 Effacement (%): 60 Station: -3 Exam by:: k Makesha Belitz rn Blood pressure 121/71, pulse 98,  temperature 98.5 F (36.9 C), temperature source Oral, resp. rate 20, height 4' 10.5" (1.486 m), weight 83.915 kg (185 lb), last menstrual period 08/24/2012. Maternal Exam:  Uterine Assessment: Contraction strength is mild.  Contraction frequency is irregular.   Abdomen: Patient reports no abdominal tenderness. Fetal presentation: vertex  Pelvis: adequate for delivery.   Cervix: Cervix evaluated by digital exam.     Fetal Exam Fetal Monitor Review: Mode: ultrasound.   Baseline rate: 120.  Variability: moderate (6-25 bpm).   Pattern: accelerations present and no decelerations.    Fetal State Assessment: Category I - tracings are normal.     Physical Exam  Constitutional: [name] oriented to person, place, and time. She appears well-developed and well-nourished.  HENT:  Head: Normocephalic.  Neck: Normal range of motion.  Cardiovascular: Normal rate and regular rhythm.   Respiratory: Effort normal and breath sounds normal.  GI: Soft.  gravid  Genitourinary:  SVE: pt very tender on exam, began screaming and crying. Pt/husband states she's been like this entire pregnancy. Cx posterior and to maternal Rt- able to reach w/ one finger but pt would not tolerate both fingers reaching cx. Felt to be app 4/60/-3 w/ bbow   Musculoskeletal: Normal range of motion.  Neurological: [name] alert and oriented to person, place, and time. She has normal reflexes.  Skin: Skin is warm and dry.  Psychiatric: She has a normal mood and affect. Her behavior is normal. Judgment and thought content normal.  Prenatal labs: ABO, Rh: --/--/O POS (07/05 2000) Antibody: NEG (07/05 2000) Rubella: 2.28 (02/11 1049) RPR: NON REAC (04/08 1057)  HBsAg: NEGATIVE (02/11 1049)  HIV: NON REACTIVE (02/11 1049)  GBS: Negative (06/11 0000)  1hr glucola: 113  Assessment/Plan: A:  [redacted]w[redacted]d SIUP  G1P0   IOL d/t SGA w/ marginal cord insertion   Cat I FHR  GBS neg  Extreme reaction w/ SVE, ?vaginismus      P:  Admit to BS  Pitocin per protocol  IV pain meds/epidural prn  AROM when vtx lower  Anticipate NSVD     Marge Duncans 05/31/2013, 11:07 PM

## 2013-06-01 ENCOUNTER — Encounter (HOSPITAL_COMMUNITY): Payer: Self-pay

## 2013-06-01 DIAGNOSIS — O365990 Maternal care for other known or suspected poor fetal growth, unspecified trimester, not applicable or unspecified: Secondary | ICD-10-CM

## 2013-06-01 MED ORDER — SODIUM CHLORIDE 0.9 % IJ SOLN: 3.0000 mL | INTRAMUSCULAR | Status: DC | PRN

## 2013-06-01 MED ORDER — WITCH HAZEL-GLYCERIN EX PADS: 1.0000 "application " | MEDICATED_PAD | CUTANEOUS | Status: DC | PRN

## 2013-06-01 MED ORDER — PRENATAL MULTIVITAMIN CH
1.0000 | ORAL_TABLET | Freq: Every day | ORAL | Status: DC
  Administered 2013-06-01 – 2013-06-02 (×2): 1 via ORAL
  Filled 2013-06-01 (×2): qty 1

## 2013-06-01 MED ORDER — ONDANSETRON HCL 4 MG/2ML IJ SOLN: 4.0000 mg | INTRAMUSCULAR | Status: DC | PRN

## 2013-06-01 MED ORDER — SODIUM CHLORIDE 0.9 % IJ SOLN
3.0000 mL | Freq: Two times a day (BID) | INTRAMUSCULAR | Status: DC
  Administered 2013-06-01: 3 mL via INTRAVENOUS

## 2013-06-01 MED ORDER — BENZOCAINE-MENTHOL 20-0.5 % EX AERO
1.0000 "application " | INHALATION_SPRAY | CUTANEOUS | Status: DC | PRN
  Filled 2013-06-01 (×2): qty 56

## 2013-06-01 MED ORDER — TETANUS-DIPHTH-ACELL PERTUSSIS 5-2.5-18.5 LF-MCG/0.5 IM SUSP: 0.5000 mL | Freq: Once | INTRAMUSCULAR | Status: DC

## 2013-06-01 MED ORDER — DIBUCAINE 1 % RE OINT
1.0000 "application " | TOPICAL_OINTMENT | RECTAL | Status: DC | PRN
  Filled 2013-06-01: qty 28

## 2013-06-01 MED ORDER — MEASLES, MUMPS & RUBELLA VAC ~~LOC~~ INJ
0.5000 mL | INJECTION | Freq: Once | SUBCUTANEOUS | Status: DC
  Filled 2013-06-01: qty 0.5

## 2013-06-01 MED ORDER — OXYCODONE-ACETAMINOPHEN 5-325 MG PO TABS: 1.0000 | ORAL_TABLET | ORAL | Status: DC | PRN

## 2013-06-01 MED ORDER — LANOLIN HYDROUS EX OINT: TOPICAL_OINTMENT | CUTANEOUS | Status: DC | PRN

## 2013-06-01 MED ORDER — AMPICILLIN 500 MG PO CAPS
500.0000 mg | ORAL_CAPSULE | Freq: Three times a day (TID) | ORAL | Status: DC
  Administered 2013-06-01 – 2013-06-02 (×5): 500 mg via ORAL
  Filled 2013-06-01 (×8): qty 1

## 2013-06-01 MED ORDER — IBUPROFEN 600 MG PO TABS
600.0000 mg | ORAL_TABLET | Freq: Four times a day (QID) | ORAL | Status: DC
  Administered 2013-06-01 – 2013-06-02 (×7): 600 mg via ORAL
  Filled 2013-06-01 (×7): qty 1

## 2013-06-01 MED ORDER — ZOLPIDEM TARTRATE 5 MG PO TABS: 5.0000 mg | ORAL_TABLET | Freq: Every evening | ORAL | Status: DC | PRN

## 2013-06-01 MED ORDER — SENNOSIDES-DOCUSATE SODIUM 8.6-50 MG PO TABS: 2.0000 | ORAL_TABLET | Freq: Every day | ORAL | Status: DC

## 2013-06-01 MED ORDER — SIMETHICONE 80 MG PO CHEW: 80.0000 mg | CHEWABLE_TABLET | ORAL | Status: DC | PRN

## 2013-06-01 MED ORDER — ONDANSETRON HCL 4 MG PO TABS: 4.0000 mg | ORAL_TABLET | ORAL | Status: DC | PRN

## 2013-06-01 MED ORDER — SODIUM CHLORIDE 0.9 % IV SOLN: 250.0000 mL | INTRAVENOUS | Status: DC | PRN

## 2013-06-01 MED ORDER — OXYTOCIN 40 UNITS IN LACTATED RINGERS INFUSION - SIMPLE MED: 62.5000 mL/h | INTRAVENOUS | Status: DC | PRN

## 2013-06-01 MED ORDER — BISACODYL 10 MG RE SUPP
10.0000 mg | Freq: Every day | RECTAL | Status: DC | PRN
  Filled 2013-06-01: qty 1

## 2013-06-01 MED ORDER — FLEET ENEMA 7-19 GM/118ML RE ENEM: 1.0000 | ENEMA | Freq: Every day | RECTAL | Status: DC | PRN

## 2013-06-01 MED ORDER — DIPHENHYDRAMINE HCL 25 MG PO CAPS: 25.0000 mg | ORAL_CAPSULE | Freq: Four times a day (QID) | ORAL | Status: DC | PRN

## 2013-06-01 NOTE — Anesthesia Postprocedure Evaluation (Addendum)
  Anesthesia Post-op Note  Patient: Andrea [name]  Procedure(s) Performed: * No procedures listed *  Patient Location: women's unit  Anesthesia Type:Epidural  Level of Consciousness: awake and alert   Airway and Oxygen Therapy: Patient Spontanous Breathing  Post-op Pain: mild  Post-op Assessment: Patient's Cardiovascular Status Stable, Respiratory Function Stable, No signs of Nausea or vomiting, Pain level controlled, No headache, No residual numbness and No residual motor weakness  Post-op Vital Signs: Reviewed and stable  Complications: No apparent anesthesia complications

## 2013-06-02 MED ORDER — IBUPROFEN 600 MG PO TABS: 600.0000 mg | ORAL_TABLET | Freq: Four times a day (QID) | ORAL | Status: DC | PRN

## 2013-06-02 NOTE — Lactation Note (Signed)
This note was copied from the chart of Andrea [name]. Lactation Consultation Note   Follow up consult with this mom and baby, now 32 hours post partum. Mom has sore nipples. On exam, her nipples appear healthy, everted but no shaft. I assisted mom with using football hold. The baby latched quickly with strong suckles . No audible swallows, and no colostrum expressed with hand expression. Baby has had good wet and dirty diapers. I did need to increase the flange on his bottom lip.  Basic teaching done on  Breast feeding from the Baby and Me book. I gave mom comfort gels, and instructed her in their use. I told mom to apply colostrum to her nipples,  if she can express some , after feeding.  Mom knows to call for questions/concerns.  Patient Name: Andrea [name] ZOXWR'U Date: 06/02/2013 Reason for consult: Follow-up assessment;Infant < 6lbs   Maternal Data    Feeding Feeding Type: Breast Milk Feeding method: Breast Length of feed: 15 min  LATCH Score/Interventions Latch: Grasps breast easily, tongue down, lips flanged, rhythmical sucking. Intervention(s): Skin to skin;Teach feeding cues;Waking techniques Intervention(s): Adjust position;Assist with latch  Audible Swallowing: None  Type of Nipple: Everted at rest and after stimulation (no shaft)  Comfort (Breast/Nipple): Filling, red/small blisters or bruises, mild/mod discomfort  Problem noted: Mild/Moderate discomfort Interventions (Mild/moderate discomfort): Comfort gels  Hold (Positioning): Assistance needed to correctly position infant at breast and maintain latch.  LATCH Score: 6  Lactation Tools Discussed/Used     Consult Status Consult Status: Follow-up Date: 06/03/13 Follow-up type: In-patient    Alfred Levins 06/02/2013, 9:34 AM

## 2013-06-02 NOTE — Discharge Summary (Signed)
Obstetric Discharge Summary Reason for Admission: induction of labor for SGA and marginal cord insertion Prenatal Procedures: NST and ultrasound Intrapartum Procedures: spontaneous vaginal delivery Postpartum Procedures: none Complications-Operative and Postpartum: none Hemoglobin  Date Value Range Status  05/31/2013 13.4  12.0 - 15.0 g/dL Final     HCT  Date Value Range Status  05/31/2013 39.2  36.0 - 46.0 % Final    Physical Exam:  General: alert, cooperative and no distress Lochia: appropriate Uterine Fundus: firm Incision: n/a DVT Evaluation: No evidence of DVT seen on physical exam. Negative Homan's sign.  Discharge Diagnoses: Term Pregnancy-delivered  Discharge Information: Date: 06/02/2013 Activity: pelvic rest Diet: routine Medications: Ibuprofen Condition: stable Instructions: refer to practice specific booklet Discharge to: home Follow-up Information   Follow up with [facility]. Schedule an appointment as soon as possible for a visit in 5 weeks.   Contact information:   [address] [place] [address] [phone]      Newborn Data: Live born female  Birth Weight: 6 lb 2.2 oz (2785 g) APGAR: 7, 9  Home with mother.  LEGGETT,KELLY H. 06/02/2013, 7:09 AM

## 2013-06-02 NOTE — Progress Notes (Signed)
Ur chart review completed.  

## 2013-06-03 ENCOUNTER — Ambulatory Visit: Payer: Self-pay

## 2013-06-03 NOTE — Lactation Note (Signed)
This note was copied from the chart of Andrea [name]. Lactation Consultation Note  Patient Name: Andrea Ski Debord ZOXWR'U Date: 06/03/2013 Reason for consult: Follow-up assessment   Maternal Data    Feeding Feeding Type: Breast Milk Feeding method: Breast Length of feed: 20 min  LATCH Score/Interventions Latch: Grasps breast easily, tongue down, lips flanged, rhythmical sucking. Intervention(s): Skin to skin Intervention(s): Adjust position  Audible Swallowing: A few with stimulation Intervention(s): Skin to skin  Type of Nipple: Everted at rest and after stimulation Intervention(s): Shells  Comfort (Breast/Nipple): Filling, red/small blisters or bruises, mild/mod discomfort Problem noted: Engorgment  Problem noted: Mild/Moderate discomfort Interventions (Mild/moderate discomfort): Comfort gels;Pre-pump if needed (discomfort imporved with shield) Interventions (Severe discomfort): Double electric pum (to supplement baby with EBM)  Hold (Positioning): Assistance needed to correctly position infant at breast and maintain latch. Intervention(s): Support Pillows  LATCH Score: 7  Lactation Tools Discussed/Used Tools: Nipple Shields   Consult Status Consult Status: Follow-up Date: 06/04/13 Follow-up type: In-patient    Alfred Levins 06/03/2013, 2:29 PM

## 2013-06-03 NOTE — Lactation Note (Addendum)
This note was copied from the chart of Andrea Areeba Sago. Lactation Consultation Note     Follow up consult with this baby, now at 7.5 % weight loss and 56 hours post partum. Baby has sufficient wet and dirty diapers, is increasingly jaundiced, but not needing phototherapy lights yet, as per Dr.chandler. Candie Chroman is a vigorous feeder, but mom reports severe pain with latch, and now has some red, raw  areas on her left nipple. 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  The baby dimples  His cheeks with sucking , and his swallow appears interrupted/dysrhythmic. No clicking heard. Dr. Ave Filter aware of my findings. I also had Christella Hartigan come to assess this mom and baby. She suggested mom retry nipple shields, and the 20 was small, the 24 a little big, but a better fit. Slight amount of colostrum seen in shield after feeding. Mom to pump with  DEP every 3-4 hours, and supplement with curved tip syring, into NS. Mom  Was shown how to do this. Mom reports discomfort level much decreased today, with shield. The plan is for the baby to stay one more night, and for mom to come back  For o/p lactation visit on Thursday, at 9 am 7/10.  Patient Name: Andrea [name] Today's Date: 06/03/2013     Maternal Data    Feeding Feeding Type: Breast Milk Feeding method: Breast Length of feed: 20 min  LATCH Score/Interventions Latch: Grasps breast easily, tongue down, lips flanged, rhythmical sucking. Intervention(s): Skin to skin Intervention(s): Adjust position  Audible Swallowing: A few with stimulation Intervention(s): Skin to skin  Type of Nipple: Everted at rest and after stimulation Intervention(s): Shells  Comfort (Breast/Nipple): Filling, red/small blisters or bruises, mild/mod discomfort  Problem noted: Mild/Moderate discomfort  Hold (Positioning): Assistance needed to  correctly position infant at breast and maintain latch. Intervention(s): Support Pillows  LATCH Score: 7  Lactation Tools Discussed/Used Tools: Nipple Dorris Carnes   Consult Status      Andrea [name] 06/03/2013, 2:11 PM

## 2013-06-04 ENCOUNTER — Ambulatory Visit: Payer: Self-pay

## 2013-06-04 NOTE — Lactation Note (Signed)
This note was copied from the chart of Andrea [name]. Lactation Consultation Note     Follow up consult with this mom and baby, and dad. Baby is now 82 hours post partum, and is at 8.5 % weight loss. His discharge wt is 5 lbs 10 oz. Mom has been breast feeding on cue, at least every hours. Baby voiding and stooling well - stool yellow, seedy.  On exam, mom's milk is transitioning in, and her beast are full, but soft. Her nipples are slightly raw, and pink, no bleeding. Mom reports latching still painful. I parents information on oral surgeon, Dr. Chales Salmon, but informed them he did not have an opening for a visit for at least a month. I spoke to [facility], speech therapy, and she feels seeing her would not benefit their baby in any way at this time. iIalso gave them the name of a dentitst in [place], Dr. Wilhemina Bonito, who does laser revision of tongue and lip ties.  I told parents that once mom's milk comes in, Kerro's sucking pattern may improve, and no intervention may be needed. i will try and stop in at the o/p consult tomorrow at 9am. Candie Chroman  will also see pediatrician tomorrow. If baby's weight is still dropping significantly, mom will rent a DEP, so she can supplemtn with EBM, and protect her milk supply. I advised mom to just breast feed until the consult tomorrow morning.  Patient Name: Andrea [name] ZOXWR'U Date: 06/04/2013 Reason for consult: Follow-up assessment   Maternal Data    Feeding    LATCH Score/Interventions                      Lactation Tools Discussed/Used     Consult Status Consult Status: Follow-up Date: 06/05/13 (9 am lactation o/p) Follow-up type: Out-patient    Alfred Levins 06/04/2013, 3:05 PM

## 2013-06-05 ENCOUNTER — Ambulatory Visit (HOSPITAL_COMMUNITY)
Admission: RE | Admit: 2013-06-05 | Discharge: 2013-06-05 | Disposition: A | Discharge status: Home / Self Care | Payer: Medicaid Other | Source: Ambulatory Visit | Attending: Obstetrics & Gynecology | Admitting: Obstetrics & Gynecology

## 2013-06-05 NOTE — Lactation Note (Signed)
Adult Lactation Consultation Outpatient Visit Note  Patient Name: Andrea [name](mother)  [name] Date of Birth: 06-10-1986                               DOB: [birth year] Gestational Age at Delivery:40.1                BIRTH WEIGHT: 6-2.2 Type of Delivery: SVD                                 DISCHARGE WEIGHT: 5-10(8%) 06/04/13                                                                      WEIGHT TODAY: 5-10.5 Breastfeeding History: Frequency of Breastfeeding: EVERY 3 HOURS Length of Feeding: 30-45 MINUTES Voids: 6 Stools: 5 YELLOW  Supplementing / Method: Pumping:  Type of Pump:MANUAL   Frequency:ONCE  Volume:  FEW MLS  Comments:    Consultation Evaluation:Mom and 4 day old baby here for weight check and feeding assessment.  Dyad was discharged yesterday afternoon.  Mom very pleased with feedings since discharge and states nipples are no longer painful during feeding.  Breasts full and milk easily expressed.  Observed mom latch baby on deeply using good breast compression.  Cheek dimpling noted first few minutes of feeding but once milk let down this resolved and good audible swallows heard.  Reviewed basics and answered questions.  Mom post pumped after feeding but only obtained 2 mls which was dropper fed to baby. Discussed the importance of obtaining DEBP form WIC or here if baby begins having any latch for feeding problems.  Reviewed supply and demand.  Suggested they switch smart start to end of next week and come here early next week but they desire to keep schedule as is.  Encouraged to call with any concerns prior to next appointment.  Initial Feeding Assessment:RIGHT BREAST 20 minutes/left breast 5 minutes Pre-feed [id number] Post-feed [id number] Amount Transferred:28 mls Comments:  Additional Feeding Assessment: Pre-feed Weight: Post-feed Weight: Amount Transferred: Comments:  Additional Feeding Assessment: Pre-feed Weight: Post-feed Weight: Amount  Transferred: Comments:  Total Breast milk Transferred this Visit: 28 mls Total Supplement Given: 2 mls  Additional Interventions:   Follow-Up  PEDIATRICIAN TODAY; SMART START Monday 06/09/13: WIC 06/10/13; OUTPATIENT LACTATION APPOINTMENT  Thursday 06/12/13      Hansel Feinstein 06/05/2013, 9:20 AM

## 2013-06-06 ENCOUNTER — Ambulatory Visit (HOSPITAL_COMMUNITY): Payer: Medicaid Other

## 2013-06-11 ENCOUNTER — Ambulatory Visit (HOSPITAL_COMMUNITY): Payer: Medicaid Other

## 2013-06-12 ENCOUNTER — Ambulatory Visit (HOSPITAL_COMMUNITY): Admission: RE | Admit: 2013-06-12 | Payer: Medicaid Other | Source: Ambulatory Visit

## 2013-07-08 ENCOUNTER — Encounter: Payer: Self-pay | Admitting: *Deleted

## 2013-08-13 ENCOUNTER — Encounter: Payer: Medicaid Other | Admitting: Obstetrics & Gynecology

## 2013-08-13 ENCOUNTER — Ambulatory Visit (INDEPENDENT_AMBULATORY_CARE_PROVIDER_SITE_OTHER): Payer: Medicaid Other | Admitting: Obstetrics & Gynecology

## 2013-08-13 ENCOUNTER — Encounter: Payer: Self-pay | Admitting: Obstetrics & Gynecology

## 2013-08-13 VITALS — BP 126/85 | HR 101 | Temp 98.7°F | Ht 60.0 in | Wt 166.0 lb

## 2013-08-13 DIAGNOSIS — Z3049 Encounter for surveillance of other contraceptives: Secondary | ICD-10-CM

## 2013-08-13 DIAGNOSIS — Z01812 Encounter for preprocedural laboratory examination: Secondary | ICD-10-CM

## 2013-08-13 DIAGNOSIS — Z30017 Encounter for initial prescription of implantable subdermal contraceptive: Secondary | ICD-10-CM

## 2013-08-13 DIAGNOSIS — Z309 Encounter for contraceptive management, unspecified: Secondary | ICD-10-CM

## 2013-08-13 MED ORDER — ETONOGESTREL 68 MG ~~LOC~~ IMPL
68.0000 mg | DRUG_IMPLANT | Freq: Once | SUBCUTANEOUS | Status: AC
  Administered 2013-08-13: 68 mg via SUBCUTANEOUS

## 2013-08-13 NOTE — Patient Instructions (Signed)

## 2013-08-13 NOTE — Progress Notes (Signed)
Patient ID: Andrea [name], female   DOB: Oct 28, 1986, 27 y.o.   MRN: [id number] Patient given informed consent, she signed consent form. Pregnancy test was negative.  The pt is PP.  [name] s/p SVD with no complications.   Appropriate time out taken.  Patient's left arm was prepped and draped in the usual sterile fashion.. The ruler used to measure and mark insertion area.  Patient was prepped with alcohol swab and then injected with 5 ml of 1 % lidocaine.  She was prepped with betadine, Nexplanon removed from packaging,  Device confirmed in needle, then inserted full length of needle and withdrawn per handbook instructions.  There was minimal blood loss.  Patient insertion site covered with guaze and a pressure bandage to reduce any bruising.  The patient tolerated the procedure well and was given post procedure instructions. Return in about 3 months for Nexplanon check.

## 2013-10-02 ENCOUNTER — Other Ambulatory Visit: Payer: Self-pay

## 2013-10-16 ENCOUNTER — Encounter: Payer: Self-pay | Admitting: *Deleted

## 2013-12-04 IMAGING — US US OB FOLLOW-UP
1 series · 12 of 28 positions shown · non-contrast
Comparison: none

[Series 1: us ob follow up · 12 of 30 slices shown]
[im 2/30]
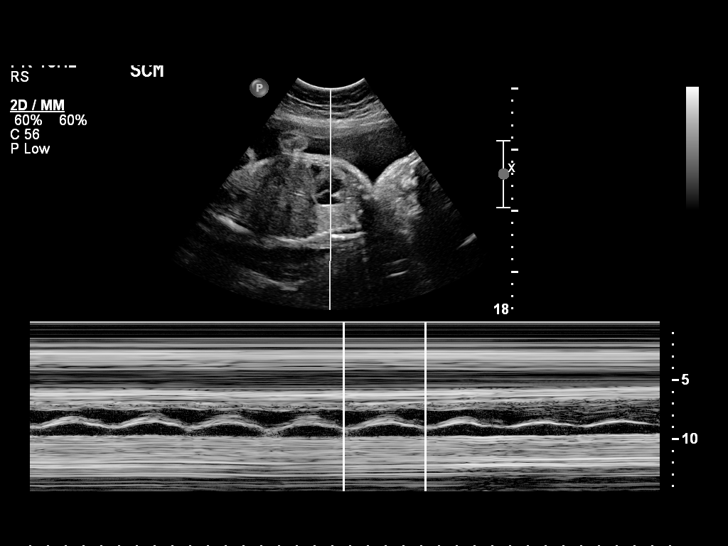
[im 4/30]
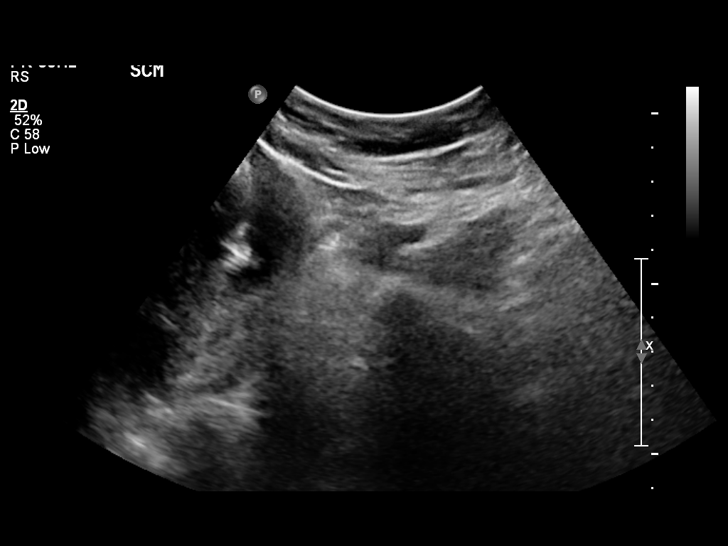
[im 6/30]
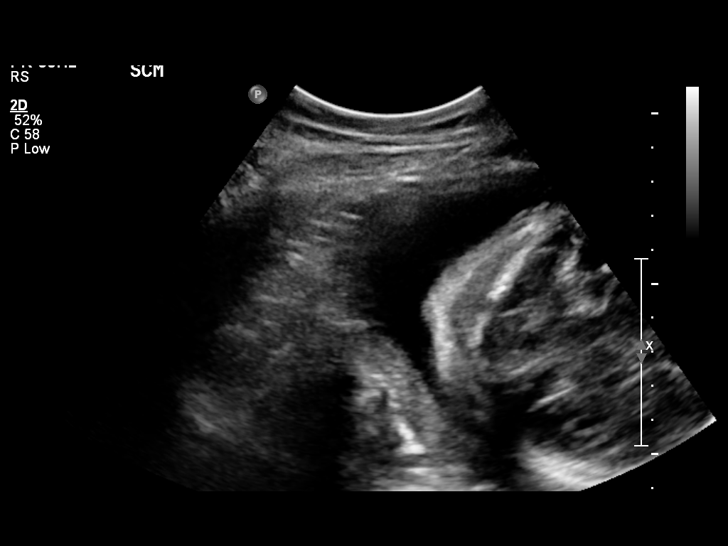
[im 9/30]
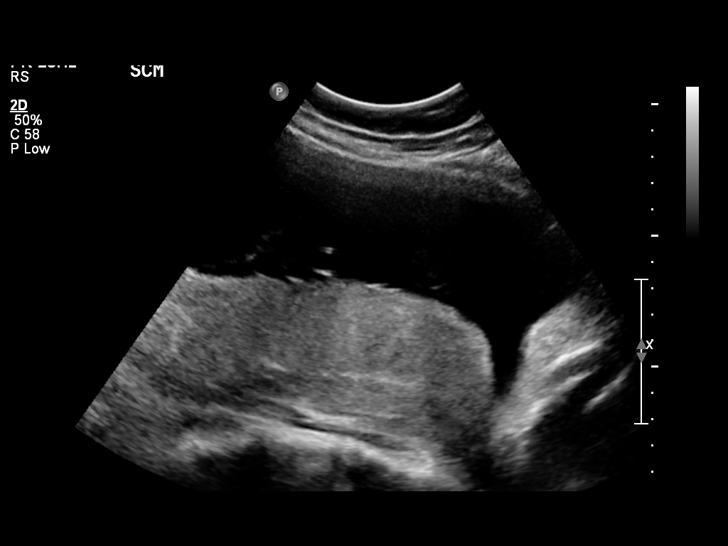
[im 11/30]
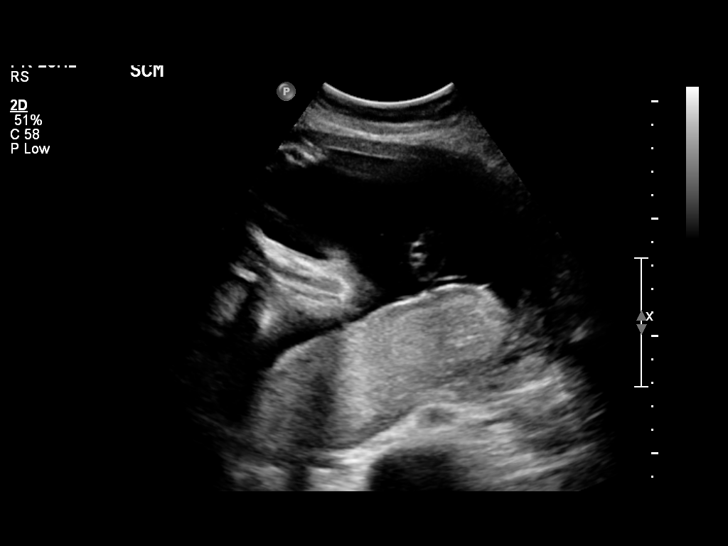
[im 13/30]
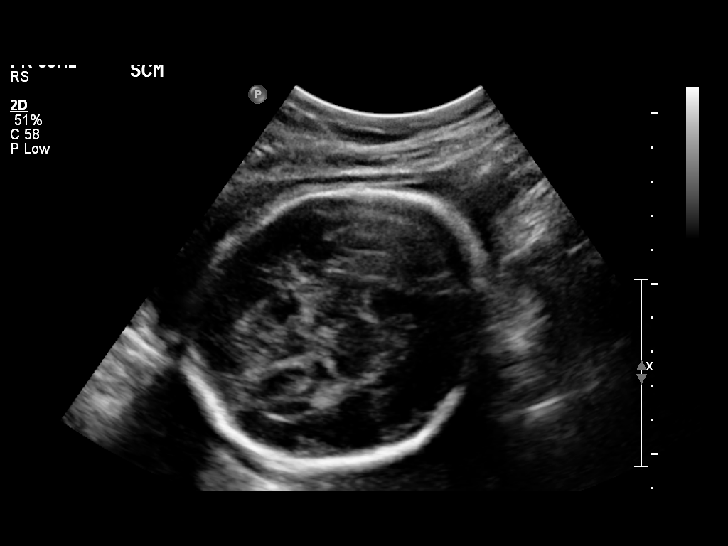
[im 17/30]
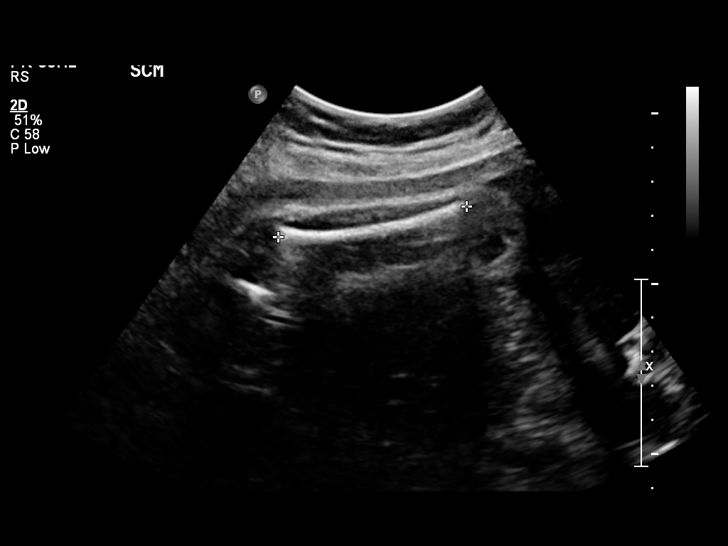
[im 19/30]
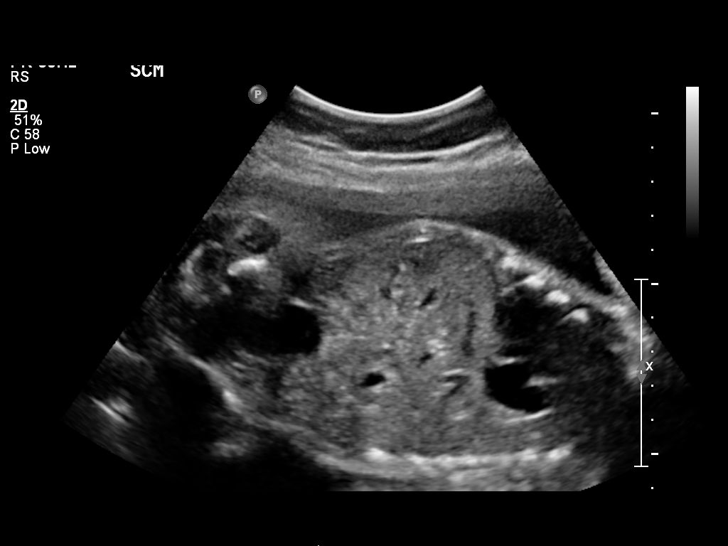
[im 21/30]
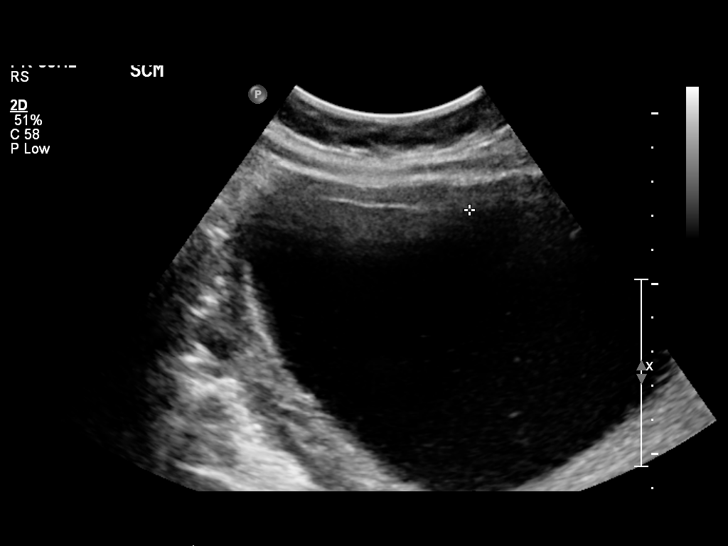
[im 24/30]
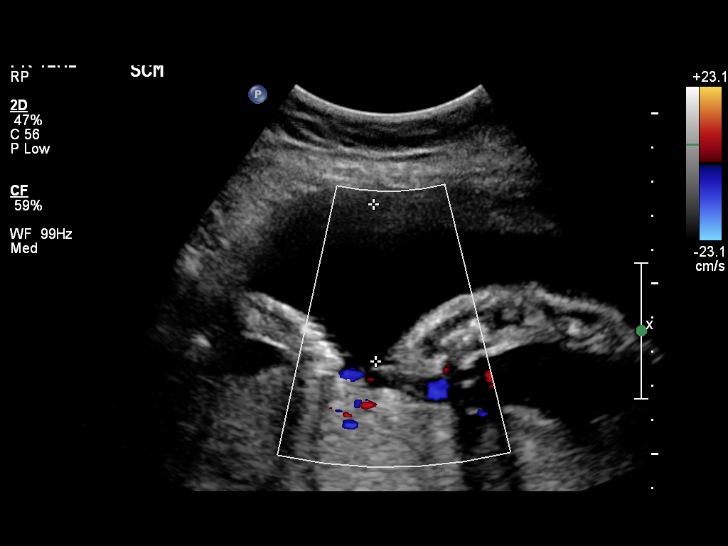
[im 26/30]
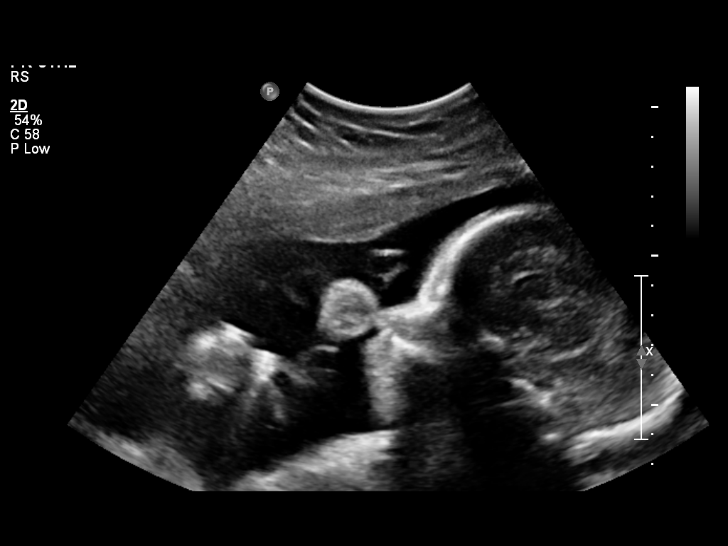
[im 28/30]
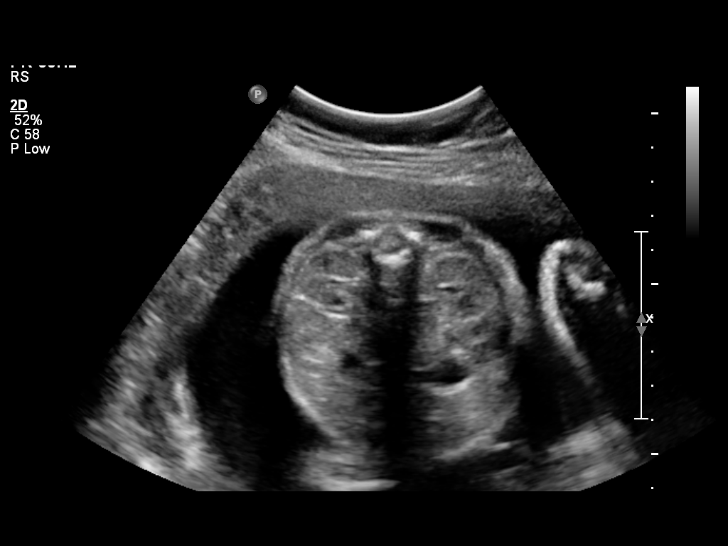

[12 of 28 positions shown; findings below may reference images not displayed]

OBSTETRICS REPORT
                      (Signed Final 03/31/2013 [DATE])

Service(s) Provided

 US OB FOLLOW UP                                       76816.1
Indications

 No or Little Prenatal Care
 Marginal insertion of umbilical cord
Fetal Evaluation

 Num Of Fetuses:    1
 Fetal Heart Rate:  130                         bpm
 Cardiac Activity:  Observed
 Presentation:      Cephalic
 Placenta:          Posterior, above cervical
                    os
 P. Cord            Marginal insertion
 Insertion:         previously

 Amniotic Fluid
 AFI FV:      Subjectively within normal limits
 AFI Sum:     21.27   cm      82   %Tile     Larg Pckt:   8.37   cm
 RUQ:   8.37   cm    RLQ:    3.97   cm    LUQ:   4.59    cm   LLQ:    4.34   cm
Biometry

 BPD:     75.3  mm    G. Age:   30w 2d                CI:        77.94   70 - 86
                                                      FL/HC:      20.7   19.3 -

 HC:     269.9  mm    G. Age:   29w 3d      < 3  %    HC/AC:      1.04   0.96 -

 AC:     260.3  mm    G. Age:   30w 1d       18  %    FL/BPD:     74.4   71 - 87
 FL:        56  mm    G. Age:   29w 4d        5  %    FL/AC:      21.5   20 - 24

 Est. FW:    7216  gm      3 lb 4 oz     27  %
Gestational Age

 LMP:           31w 2d       Date:   08/24/12                 EDD:   05/31/13
 U/S Today:     29w 6d                                        EDD:   06/10/13
 Best:          31w 2d    Det. By:   LMP  (08/24/12)          EDD:   05/31/13
Anatomy

 Cranium:          Appears normal         Aortic Arch:      Previously seen
 Fetal Cavum:      Previously seen        Ductal Arch:      Previously seen
 Ventricles:       Appears normal         Diaphragm:        Appears normal
 Choroid Plexus:   Previously seen        Stomach:          Appears normal, left
                                                            sided
 Cerebellum:       Previously seen        Abdomen:          Appears normal
 Posterior Fossa:  Previously seen        Abdominal Wall:   Previously seen
 Nuchal Fold:      Previously seen        Cord Vessels:     Previously seen
 Face:             Orbits appear          Kidneys:          Appear normal
                   normal
 Lips:             Previously seen        Bladder:          Appears normal
 Heart:            Appears normal         Spine:            Previously seen
                   (4CH, axis, and
                   situs)
 RVOT:             Previously seen        Lower             Previously seen
                                          Extremities:
 LVOT:             Previously seen        Upper             Previously seen
                                          Extremities:

 Other:  Male gender. Heels, Rt 5th, Nasal bone previously visualized.
         Technically difficult due to fetal position.
Cervix Uterus Adnexa

 Cervical Length:   3.18      cm

 Cervix:       Normal appearance by transabdominal scan.

 Adnexa:     No abnormality visualized.
Impression

 Single live IUP in cephalic presentation.  Nearly 2 weeks
 behind assigned GA by LMP. EFW 27%ile. Consider follow
 up to assess growth.
 No late-developing anomaly in visualized structures above.

 questions or concerns.

## 2014-09-28 ENCOUNTER — Encounter: Payer: Self-pay | Admitting: Obstetrics & Gynecology

## 2017-05-10 DIAGNOSIS — R102 Pelvic and perineal pain: Secondary | ICD-10-CM | POA: Diagnosis not present

## 2017-05-10 DIAGNOSIS — N915 Oligomenorrhea, unspecified: Secondary | ICD-10-CM | POA: Diagnosis not present

## 2017-05-10 DIAGNOSIS — N644 Mastodynia: Secondary | ICD-10-CM | POA: Diagnosis not present

## 2017-05-10 DIAGNOSIS — R350 Frequency of micturition: Secondary | ICD-10-CM | POA: Diagnosis not present

## 2017-05-21 DIAGNOSIS — N970 Female infertility associated with anovulation: Secondary | ICD-10-CM | POA: Diagnosis not present

## 2017-05-21 DIAGNOSIS — N7011 Chronic salpingitis: Secondary | ICD-10-CM | POA: Insufficient documentation

## 2017-05-21 DIAGNOSIS — N838 Other noninflammatory disorders of ovary, fallopian tube and broad ligament: Secondary | ICD-10-CM

## 2017-05-21 DIAGNOSIS — N839 Noninflammatory disorder of ovary, fallopian tube and broad ligament, unspecified: Secondary | ICD-10-CM | POA: Diagnosis not present

## 2017-05-21 DIAGNOSIS — R102 Pelvic and perineal pain: Secondary | ICD-10-CM | POA: Diagnosis not present

## 2017-05-21 DIAGNOSIS — N915 Oligomenorrhea, unspecified: Secondary | ICD-10-CM | POA: Diagnosis not present

## 2017-05-21 HISTORY — DX: Female infertility associated with anovulation: N97.0

## 2017-05-21 HISTORY — DX: Other noninflammatory disorders of ovary, fallopian tube and broad ligament: N83.8

## 2017-06-21 DIAGNOSIS — Z3687 Encounter for antenatal screening for uncertain dates: Secondary | ICD-10-CM | POA: Diagnosis not present

## 2017-06-21 DIAGNOSIS — Z3201 Encounter for pregnancy test, result positive: Secondary | ICD-10-CM | POA: Diagnosis not present

## 2017-08-06 ENCOUNTER — Ambulatory Visit (INDEPENDENT_AMBULATORY_CARE_PROVIDER_SITE_OTHER): Payer: BLUE CROSS/BLUE SHIELD | Admitting: Medical

## 2017-08-06 ENCOUNTER — Encounter: Payer: Self-pay | Admitting: Medical

## 2017-08-06 VITALS — BP 120/69 | HR 91 | Wt 174.3 lb

## 2017-08-06 DIAGNOSIS — Z113 Encounter for screening for infections with a predominantly sexual mode of transmission: Secondary | ICD-10-CM

## 2017-08-06 DIAGNOSIS — Z3480 Encounter for supervision of other normal pregnancy, unspecified trimester: Secondary | ICD-10-CM | POA: Insufficient documentation

## 2017-08-06 DIAGNOSIS — Z1151 Encounter for screening for human papillomavirus (HPV): Secondary | ICD-10-CM | POA: Diagnosis not present

## 2017-08-06 DIAGNOSIS — Z23 Encounter for immunization: Secondary | ICD-10-CM

## 2017-08-06 DIAGNOSIS — Z3402 Encounter for supervision of normal first pregnancy, second trimester: Secondary | ICD-10-CM

## 2017-08-06 DIAGNOSIS — Z124 Encounter for screening for malignant neoplasm of cervix: Secondary | ICD-10-CM | POA: Diagnosis not present

## 2017-08-06 NOTE — Patient Instructions (Signed)
Second Trimester of Pregnancy The second trimester is from week 13 through week 28, month 4 through 6. This is often the time in pregnancy that you feel your best. Often times, morning sickness has lessened or quit. You may have more energy, and you may get hungry more often. Your unborn baby (fetus) is growing rapidly. At the end of the sixth month, he or she is about 9 inches long and weighs about 1 pounds. You will likely feel the baby move (quickening) between 18 and 20 weeks of pregnancy. Follow these instructions at home:  Avoid all smoking, herbs, and alcohol. Avoid drugs not approved by your doctor.  Do not use any tobacco products, including cigarettes, chewing tobacco, and electronic cigarettes. If you need help quitting, ask your doctor. You may get counseling or other support to help you quit.  Only take medicine as told by your doctor. Some medicines are safe and some are not during pregnancy.  Exercise only as told by your doctor. Stop exercising if you start having cramps.  Eat regular, healthy meals.  Wear a good support bra if your breasts are tender.  Do not use hot tubs, steam rooms, or saunas.  Wear your seat belt when driving.  Avoid raw meat, uncooked cheese, and liter boxes and soil used by cats.  Take your prenatal vitamins.  Take 1500-2000 milligrams of calcium daily starting at the 20th week of pregnancy until you deliver your baby.  Try taking medicine that helps you poop (stool softener) as needed, and if your doctor approves. Eat more fiber by eating fresh fruit, vegetables, and whole grains. Drink enough fluids to keep your pee (urine) clear or pale yellow.  Take warm water baths (sitz baths) to soothe pain or discomfort caused by hemorrhoids. Use hemorrhoid cream if your doctor approves.  If you have puffy, bulging veins (varicose veins), wear support hose. Raise (elevate) your feet for 15 minutes, 3-4 times a day. Limit salt in your diet.  Avoid heavy  lifting, wear low heals, and sit up straight.  Rest with your legs raised if you have leg cramps or low back pain.  Visit your dentist if you have not gone during your pregnancy. Use a soft toothbrush to brush your teeth. Be gentle when you floss.  You can have sex (intercourse) unless your doctor tells you not to.  Go to your doctor visits. Get help if:  You feel dizzy.  You have mild cramps or pressure in your lower belly (abdomen).  You have a nagging pain in your belly area.  You continue to feel sick to your stomach (nauseous), throw up (vomit), or have watery poop (diarrhea).  You have bad smelling fluid coming from your vagina.  You have pain with peeing (urination). Get help right away if:  You have a fever.  You are leaking fluid from your vagina.  You have spotting or bleeding from your vagina.  You have severe belly cramping or pain.  You lose or gain weight rapidly.  You have trouble catching your breath and have chest pain.  You notice sudden or extreme puffiness (swelling) of your face, hands, ankles, feet, or legs.  You have not felt the baby move in over an hour.  You have severe headaches that do not go away with medicine.  You have vision changes. This information is not intended to replace advice given to you by your health care provider. Make sure you discuss any questions you have with your health care   provider. Document Released: 02/07/2010 Document Revised: 04/20/2016 Document Reviewed: 01/14/2013 Elsevier Interactive Patient Education  2017 [employer]  

## 2017-08-06 NOTE — Progress Notes (Signed)
   PRENATAL VISIT NOTE  Subjective:  Andrea [name] is a 31 y.o. G2P1001 at 4067w0d being seen today for her first prenatal care visit.  [name] currently monitored for the following issues for this low-risk pregnancy and has Supervision of other normal pregnancy, antepartum on her problem list.  Patient reports nausea with occasional vomiting.  Contractions: Not present. Vag. Bleeding: None.  Movement: Absent. Denies leaking of fluid.   The following portions of the patient's history were reviewed and updated as appropriate: allergies, current medications, past family history, past medical history, past social history, past surgical history and problem list. Problem list updated.  Objective:   Vitals:   08/06/17 1445  BP: 120/69  Pulse: 91  Weight: 174 lb 4.8 oz (79.1 kg)    Fetal Status: Fetal Heart Rate (bpm): 140   Movement: Absent     General:  Alert, oriented and cooperative. Patient is in no acute distress.  Skin: Skin is warm and dry. No rash noted.   Cardiovascular: Normal heart rate noted  Respiratory: Normal respiratory effort, no problems with respiration noted  Abdomen: Soft, gravid, appropriate for gestational age.  Pain/Pressure: Absent     Pelvic: Cervical exam performed        Extremities: Normal range of motion.  Edema: None  Mental Status:  Normal mood and affect. Normal behavior. Normal judgment and thought content.   Assessment and Plan:  Pregnancy: G2P1001 at 167w0d  1. Encounter for supervision of normal first pregnancy in second trimester - Cytology - PAP - Obstetric Panel, Including HIV - Hemoglobinopathy Evaluation - AFP TETRA - US MFM OB COMP + 14 WK; Future - Flu Vaccine QUAD 36+ mos IM (Fluarix, Quad PF) - Enroll patient in [employer] [facility] - Discussed nature of our practice with multiple providers   Preterm labor/secondd trimester warning symptoms and general obstetric precautions including but not limited to vaginal bleeding, contractions,  leaking of fluid and fetal movement were reviewed in detail with the patient. Please refer to After Visit Summary for other counseling recommendations.  Return in about 4 weeks (around 09/03/2017) for LOB.   Vonzella NippleJulie Wenzel, PA-C

## 2017-08-08 LAB — CYTOLOGY - PAP
ADEQUACY: ABSENT
CHLAMYDIA, DNA PROBE: NEGATIVE
Diagnosis: NEGATIVE
HPV (WINDOPATH): NOT DETECTED
NEISSERIA GONORRHEA: NEGATIVE

## 2017-08-15 LAB — OBSTETRIC PANEL, INCLUDING HIV
Antibody Screen: NEGATIVE
Basophils Absolute: 0 10*3/uL (ref 0.0–0.2)
Basos: 0 %
EOS (ABSOLUTE): 0.1 10*3/uL (ref 0.0–0.4)
EOS: 1 %
HEMOGLOBIN: 12.8 g/dL (ref 11.1–15.9)
HIV Screen 4th Generation wRfx: NONREACTIVE
Hematocrit: 38.9 % (ref 34.0–46.6)
Hepatitis B Surface Ag: NEGATIVE
IMMATURE GRANULOCYTES: 0 %
Immature Grans (Abs): 0 10*3/uL (ref 0.0–0.1)
LYMPHS: 30 %
Lymphocytes Absolute: 1.6 10*3/uL (ref 0.7–3.1)
MCH: 27.6 pg (ref 26.6–33.0)
MCHC: 32.9 g/dL (ref 31.5–35.7)
MCV: 84 fL (ref 79–97)
MONOCYTES: 7 %
MONOS ABS: 0.4 10*3/uL (ref 0.1–0.9)
NEUTROS PCT: 62 %
Neutrophils Absolute: 3.3 10*3/uL (ref 1.4–7.0)
Platelets: 238 10*3/uL (ref 150–379)
RBC: 4.63 x10E6/uL (ref 3.77–5.28)
RDW: 14.2 % (ref 12.3–15.4)
RPR Ser Ql: NONREACTIVE
RUBELLA: 2.12 {index} (ref >0.99)
Rh Factor: POSITIVE
WBC: 5.3 10*3/uL (ref 3.4–10.8)

## 2017-08-15 LAB — AFP TETRA
DIA Mom Value: 0.63
DIA Value (EIA): 102.48 pg/mL
DSR (BY AGE) 1 IN: 613
DSR (Second Trimester) 1 IN: 2950
GESTATIONAL AGE AFP: 15.3 wk
MSAFP MOM: 1.02
MSAFP: 27.1 ng/mL
MSHCG MOM: 1.19
MSHCG: 51125 m[IU]/mL
Maternal Age At EDD: 31 yr
Osb Risk: 10000
Test Results:: NEGATIVE
Weight: 174 [lb_av]
uE3 Mom: 0.7
uE3 Value: 0.41 ng/mL

## 2017-08-15 LAB — HEMOGLOBINOPATHY EVALUATION
Ferritin: 96 ng/mL (ref 15–150)
HGB A: 97.5 % (ref 96.4–98.8)
HGB F QUANT: 0 % (ref 0.0–2.0)
HGB SOLUBILITY: NEGATIVE
Hgb A2 Quant: 2.5 % (ref 1.8–3.2)
Hgb C: 0 %
Hgb S: 0 %
Hgb Variant: 0 %

## 2017-09-03 ENCOUNTER — Encounter: Payer: BLUE CROSS/BLUE SHIELD | Admitting: Advanced Practice Midwife

## 2017-09-03 ENCOUNTER — Ambulatory Visit (INDEPENDENT_AMBULATORY_CARE_PROVIDER_SITE_OTHER): Payer: BLUE CROSS/BLUE SHIELD | Admitting: Advanced Practice Midwife

## 2017-09-03 VITALS — BP 97/61 | HR 85 | Wt 176.7 lb

## 2017-09-03 DIAGNOSIS — Z3480 Encounter for supervision of other normal pregnancy, unspecified trimester: Secondary | ICD-10-CM

## 2017-09-03 NOTE — Patient Instructions (Signed)

## 2017-09-03 NOTE — Progress Notes (Signed)
   PRENATAL VISIT NOTE  Subjective:  Andrea [name] is a 31 y.o. G2P1001 at [redacted]w[redacted]d being seen today for ongoing prenatal care.  [name] currently monitored for the following issues for this low-risk pregnancy and has Supervision of other normal pregnancy, antepartum on her problem list.  Patient reports no complaints.  Contractions: Not present. Vag. Bleeding: None.  Movement: Present. Denies leaking of fluid.   The following portions of the patient's history were reviewed and updated as appropriate: allergies, current medications, past family history, past medical history, past social history, past surgical history and problem list. Problem list updated.  Objective:   Vitals:   09/03/17 0800  BP: 97/61  Pulse: 85  Weight: 176 lb 11.2 oz (80.2 kg)    Fetal Status: Fetal Heart Rate (bpm): 135   Movement: Present     General:  Alert, oriented and cooperative. Patient is in no acute distress.  Skin: Skin is warm and dry. No rash noted.   Cardiovascular: Normal heart rate noted  Respiratory: Normal respiratory effort, no problems with respiration noted  Abdomen: Soft, gravid, appropriate for gestational age.  Pain/Pressure: Absent     Pelvic: Cervical exam deferred        Extremities: Normal range of motion.  Edema: None  Mental Status:  Normal mood and affect. Normal behavior. Normal judgment and thought content.   Assessment and Plan:  Pregnancy: G2P1001 at [redacted]w[redacted]d  1. Supervision of other normal pregnancy, antepartum - Routine care - Anatomy US on 10/19  Preterm labor symptoms and general obstetric precautions including but not limited to vaginal bleeding, contractions, leaking of fluid and fetal movement were reviewed in detail with the patient. Please refer to After Visit Summary for other counseling recommendations.  Return in about 4 weeks (around 10/01/2017).   Thressa Sheller, CNM

## 2017-09-06 ENCOUNTER — Encounter (HOSPITAL_COMMUNITY): Payer: Self-pay | Admitting: Medical

## 2017-09-14 ENCOUNTER — Ambulatory Visit (HOSPITAL_COMMUNITY)
Admission: RE | Admit: 2017-09-14 | Discharge: 2017-09-14 | Disposition: A | Discharge status: Home / Self Care | Payer: BLUE CROSS/BLUE SHIELD | Source: Ambulatory Visit | Attending: Medical | Admitting: Medical

## 2017-09-14 ENCOUNTER — Other Ambulatory Visit: Payer: Self-pay | Admitting: Medical

## 2017-09-14 ENCOUNTER — Ambulatory Visit (HOSPITAL_COMMUNITY): Payer: BLUE CROSS/BLUE SHIELD

## 2017-09-14 DIAGNOSIS — Z3689 Encounter for other specified antenatal screening: Secondary | ICD-10-CM | POA: Insufficient documentation

## 2017-09-14 DIAGNOSIS — O99212 Obesity complicating pregnancy, second trimester: Secondary | ICD-10-CM

## 2017-09-14 DIAGNOSIS — Z3402 Encounter for supervision of normal first pregnancy, second trimester: Secondary | ICD-10-CM

## 2017-09-14 DIAGNOSIS — E669 Obesity, unspecified: Secondary | ICD-10-CM | POA: Insufficient documentation

## 2017-09-14 DIAGNOSIS — Z3492 Encounter for supervision of normal pregnancy, unspecified, second trimester: Secondary | ICD-10-CM

## 2017-09-14 DIAGNOSIS — Z3A19 19 weeks gestation of pregnancy: Secondary | ICD-10-CM | POA: Diagnosis not present

## 2017-09-24 ENCOUNTER — Encounter: Payer: BLUE CROSS/BLUE SHIELD | Admitting: Family Medicine

## 2017-10-01 ENCOUNTER — Ambulatory Visit (INDEPENDENT_AMBULATORY_CARE_PROVIDER_SITE_OTHER): Payer: BLUE CROSS/BLUE SHIELD | Admitting: Obstetrics and Gynecology

## 2017-10-01 ENCOUNTER — Encounter: Payer: Self-pay | Admitting: Obstetrics and Gynecology

## 2017-10-01 VITALS — BP 108/67 | HR 88 | Wt 174.5 lb

## 2017-10-01 DIAGNOSIS — Z3480 Encounter for supervision of other normal pregnancy, unspecified trimester: Secondary | ICD-10-CM

## 2017-10-01 NOTE — Patient Instructions (Signed)

## 2017-10-01 NOTE — Progress Notes (Signed)
Subjective:  Andrea [name] is a 31 y.o. G2P1001 at 3316w0d being seen today for ongoing prenatal care.  [name] currently monitored for the following issues for this low-risk pregnancy and has Supervision of normal pregnancy and Supervision of other normal pregnancy, antepartum on their problem list.  Patient reports no complaints.  Contractions: Irritability. Vag. Bleeding: None.  Movement: Present. Denies leaking of fluid.   The following portions of the patient's history were reviewed and updated as appropriate: allergies, current medications, past family history, past medical history, past social history, past surgical history and problem list. Problem list updated.  Objective:   Vitals:   10/01/17 1519  BP: 108/67  Pulse: 88  Weight: 79.2 kg (174 lb 8 oz)    Fetal Status: Fetal Heart Rate (bpm): 140   Movement: Present     General:  Alert, oriented and cooperative. Patient is in no acute distress.  Skin: Skin is warm and dry. No rash noted.   Cardiovascular: Normal heart rate noted  Respiratory: Normal respiratory effort, no problems with respiration noted  Abdomen: Soft, gravid, appropriate for gestational age. Pain/Pressure: Present     Pelvic:  Cervical exam deferred        Extremities: Normal range of motion.  Edema: None  Mental Status: Normal mood and affect. Normal behavior. Normal judgment and thought content.   Urinalysis:      Assessment and Plan:  Pregnancy: G2P1001 at 2216w0d  1. Supervision of other normal pregnancy, antepartum Stable Glucola next visit  Preterm labor symptoms and general obstetric precautions including but not limited to vaginal bleeding, contractions, leaking of fluid and fetal movement were reviewed in detail with the patient. Please refer to After Visit Summary for other counseling recommendations.  Return in about 4 weeks (around 10/29/2017) for OB visit.   Hermina StaggersErvin, Obera Stauch L, MD

## 2017-10-28 ENCOUNTER — Encounter: Payer: Self-pay | Admitting: Student

## 2017-10-30 ENCOUNTER — Encounter: Payer: BLUE CROSS/BLUE SHIELD | Admitting: Student

## 2017-10-30 ENCOUNTER — Ambulatory Visit (INDEPENDENT_AMBULATORY_CARE_PROVIDER_SITE_OTHER): Payer: BLUE CROSS/BLUE SHIELD | Admitting: Student

## 2017-10-30 VITALS — BP 111/60 | HR 97 | Wt 177.8 lb

## 2017-10-30 DIAGNOSIS — Z23 Encounter for immunization: Secondary | ICD-10-CM

## 2017-10-30 DIAGNOSIS — O26842 Uterine size-date discrepancy, second trimester: Secondary | ICD-10-CM

## 2017-10-30 DIAGNOSIS — Z3480 Encounter for supervision of other normal pregnancy, unspecified trimester: Secondary | ICD-10-CM | POA: Diagnosis not present

## 2017-10-30 DIAGNOSIS — Z3482 Encounter for supervision of other normal pregnancy, second trimester: Secondary | ICD-10-CM

## 2017-10-30 HISTORY — DX: Uterine size-date discrepancy, second trimester: O26.842

## 2017-10-30 NOTE — Progress Notes (Deleted)
   PRENATAL VISIT NOTE  Subjective:  Andrea [name] is a 31 y.o. G2P1001 at 36100w1d being seen today for ongoing prenatal care.  [name] currently monitored for the following issues for this {Blank single:19197::"high-risk","low-risk"} pregnancy and has Supervision of other normal pregnancy, antepartum on their problem list.  Patient reports {sx:14538}.  Contractions: Irritability. Vag. Bleeding: None.  Movement: Present. Denies leaking of fluid.   The following portions of the patient's history were reviewed and updated as appropriate: allergies, current medications, past family history, past medical history, past social history, past surgical history and problem list. Problem list updated.  Objective:   Vitals:   10/30/17 0848  BP: 111/60  Pulse: 97  Weight: 177 lb 12.8 oz (80.6 kg)    Fetal Status: Fetal Heart Rate (bpm): 130   Movement: Present     General:  Alert, oriented and cooperative. Patient is in no acute distress.  Skin: Skin is warm and dry. No rash noted.   Cardiovascular: Normal heart rate noted  Respiratory: Normal respiratory effort, no problems with respiration noted  Abdomen: Soft, gravid, appropriate for gestational age.  Pain/Pressure: Present     Pelvic: {Blank single:19197::"Cervical exam performed","Cervical exam deferred"}        Extremities: Normal range of motion.  Edema: None  Mental Status:  Normal mood and affect. Normal behavior. Normal judgment and thought content.   Assessment and Plan:  Pregnancy: G2P1001 at 12100w1d  1. Supervision of other normal pregnancy, antepartum *** - RPR - HIV antibody - Tdap vaccine greater than or equal to 7yo IM - Glucose Tolerance, 2 Hours w/1 Hour - CBC  {Blank single:19197::"Term","Preterm"} labor symptoms and general obstetric precautions including but not limited to vaginal bleeding, contractions, leaking of fluid and fetal movement were reviewed in detail with the patient. Please refer to After Visit Summary  for other counseling recommendations.  Return in about 4 weeks (around 11/27/2017) for Routine OB.   Judeth HornErin Dillon Mcreynolds, NP

## 2017-10-30 NOTE — Progress Notes (Signed)
   PRENATAL VISIT NOTE  Subjective:  Andrea [name] is a 31 y.o. G2P1001 at 3162w1d being seen today for ongoing prenatal care.  [name] currently monitored for the following issues for this low-risk pregnancy and has Supervision of other normal pregnancy, antepartum on their problem list.  Patient reports no complaints.  Contractions: Irritability. Vag. Bleeding: None.  Movement: Present. Denies leaking of fluid.   The following portions of the patient's history were reviewed and updated as appropriate: allergies, current medications, past family history, past medical history, past social history, past surgical history and problem list. Problem list updated.  Objective:   Vitals:   10/30/17 0848  BP: 111/60  Pulse: 97  Weight: 177 lb 12.8 oz (80.6 kg)    Fetal Status: Fetal Heart Rate (bpm): 130   Movement: Present    Fundal height 29 cm  General:  Alert, oriented and cooperative. Patient is in no acute distress.  Skin: Skin is warm and dry. No rash noted.   Cardiovascular: Normal heart rate noted  Respiratory: Normal respiratory effort, no problems with respiration noted  Abdomen: Soft, gravid, appropriate for gestational age.  Pain/Pressure: Present     Pelvic: Cervical exam deferred        Extremities: Normal range of motion.  Edema: None  Mental Status:  Normal mood and affect. Normal behavior. Normal judgment and thought content.   Assessment and Plan:  Pregnancy: G2P1001 at 10862w1d  1. Supervision of other normal pregnancy, antepartum  - RPR - HIV antibody - Tdap vaccine greater than or equal to 7yo IM - Glucose Tolerance, 2 Hours w/1 Hour - CBC  2. Uterine size date discrepancy pregnancy, second trimester -29 cm today. Recheck next visit. May need growth ultrasound.     Preterm labor symptoms and general obstetric precautions including but not limited to vaginal bleeding, contractions, leaking of fluid and fetal movement were reviewed in detail with the  patient. Please refer to After Visit Summary for other counseling recommendations.  No Follow-up on file.   Judeth HornErin Allyah Heather, NP

## 2017-10-30 NOTE — Patient Instructions (Signed)

## 2017-10-31 LAB — GLUCOSE TOLERANCE, 2 HOURS W/ 1HR
GLUCOSE, 2 HOUR: 114 mg/dL (ref 65–152)
GLUCOSE, FASTING: 85 mg/dL (ref 65–91)
Glucose, 1 hour: 145 mg/dL (ref 65–179)

## 2017-10-31 LAB — HIV ANTIBODY (ROUTINE TESTING W REFLEX): HIV Screen 4th Generation wRfx: NONREACTIVE

## 2017-10-31 LAB — CBC
Hematocrit: 37.4 % (ref 34.0–46.6)
Hemoglobin: 12.2 g/dL (ref 11.1–15.9)
MCH: 28.5 pg (ref 26.6–33.0)
MCHC: 32.6 g/dL (ref 31.5–35.7)
MCV: 87 fL (ref 79–97)
PLATELETS: 195 10*3/uL (ref 150–379)
RBC: 4.28 x10E6/uL (ref 3.77–5.28)
RDW: 14.6 % (ref 12.3–15.4)
WBC: 5.1 10*3/uL (ref 3.4–10.8)

## 2017-10-31 LAB — RPR: RPR Ser Ql: NONREACTIVE

## 2017-11-26 ENCOUNTER — Ambulatory Visit (INDEPENDENT_AMBULATORY_CARE_PROVIDER_SITE_OTHER): Payer: BLUE CROSS/BLUE SHIELD | Admitting: Advanced Practice Midwife

## 2017-11-26 ENCOUNTER — Encounter: Payer: Self-pay | Admitting: Advanced Practice Midwife

## 2017-11-26 VITALS — BP 112/60 | HR 96 | Wt 177.2 lb

## 2017-11-26 DIAGNOSIS — O26842 Uterine size-date discrepancy, second trimester: Secondary | ICD-10-CM

## 2017-11-26 DIAGNOSIS — O26843 Uterine size-date discrepancy, third trimester: Secondary | ICD-10-CM

## 2017-11-26 DIAGNOSIS — Z3480 Encounter for supervision of other normal pregnancy, unspecified trimester: Secondary | ICD-10-CM

## 2017-11-26 DIAGNOSIS — Z3483 Encounter for supervision of other normal pregnancy, third trimester: Secondary | ICD-10-CM

## 2017-11-26 NOTE — Progress Notes (Signed)
   PRENATAL VISIT NOTE  Subjective:  Andrea [name] is a 31 y.o. G2P1001 at 4017w0d being seen today for ongoing prenatal care.  [name] currently monitored for the following issues for this low-risk pregnancy and has Supervision of other normal pregnancy, antepartum and Uterine size date discrepancy pregnancy, second trimester on their problem list.  Patient reports no complaints.  Contractions: Irregular. Vag. Bleeding: None.  Movement: Present. Denies leaking of fluid.   The following portions of the patient's history were reviewed and updated as appropriate: allergies, current medications, past family history, past medical history, past social history, past surgical history and problem list. Problem list updated.  Objective:   Vitals:   11/26/17 0848  BP: 112/60  Pulse: 96  Weight: 177 lb 3.2 oz (80.4 kg)    Fetal Status: Fetal Heart Rate (bpm): 129 Fundal Height: 31 cm Movement: Present     General:  Alert, oriented and cooperative. Patient is in no acute distress.  Skin: Skin is warm and dry. No rash noted.   Cardiovascular: Normal heart rate noted  Respiratory: Normal respiratory effort, no problems with respiration noted  Abdomen: Soft, gravid, appropriate for gestational age.  Pain/Pressure: Present     Pelvic: Cervical exam deferred        Extremities: Normal range of motion.  Edema: None  Mental Status:  Normal mood and affect. Normal behavior. Normal judgment and thought content.   Assessment and Plan:  Pregnancy: G2P1001 at 417w0d  1. Supervision of other normal pregnancy, antepartum   2. Uterine size date discrepancy pregnancy, second trimester - Nml today. Will CTO   Preterm labor symptoms and general obstetric precautions including but not limited to vaginal bleeding, contractions, leaking of fluid and fetal movement were reviewed in detail with the patient. Please refer to After Visit Summary for other counseling recommendations.  Return in about 2 weeks (around  12/10/2017) for ROB.   Dorathy KinsmanVirginia Roosevelt Eimers, CNM

## 2017-11-27 NOTE — L&D Delivery Note (Signed)
Delivery Note At 9:42 AM a viable and healthy female was delivered via Vaginal, Spontaneous (Presentation: ROA ).  APGAR:8 ,9 ; weight pending .   Placenta status:spontaneous , intact .  Cord:3 vessel  with the following complications: none.    Anesthesia:  epidural Episiotomy: None Lacerations: 2nd degree;Perineal Suture Repair: 3.0 vicryl rapide Est. Blood Loss (mL): 100  Mom to postpartum.  Baby to Couplet care / Skin to Skin.   Andrea [name] is a 32 y.o. female G2P1001 with IUP at 4177w3d admitted for SROM and active labor .  She progressed without augmentation to complete and pushed less than 30 minutes to deliver.  Cord clamping delayed by 1-3 minutes then clamped by CNM and cut by FOB.  Placenta intact and spontaneous, bleeding minimal.  Very small second degree perineal laceration repaired without difficulty.  Mom and baby stable prior to transfer to postpartum. She plans on breastfeeding. [name] undecided about birth control.  Misty StanleyLisa Leftwich-Kirby 01/24/2018, 10:10 AM

## 2017-12-04 ENCOUNTER — Telehealth: Payer: Self-pay

## 2017-12-04 NOTE — Telephone Encounter (Signed)
Pt called stating that she had some type of infection of the Eye, asked her to describe symptoms.She stated Eye is red, having pain and white liquid is draining from it.Recommended to pt that she goes to Urgent care for evaluation.Pt. verbalized understanding. Pt had no other questions.

## 2017-12-06 ENCOUNTER — Telehealth: Payer: Self-pay | Admitting: *Deleted

## 2017-12-06 NOTE — Telephone Encounter (Signed)
Pt left message stating that she is [redacted] wks pregnant and wants to speak with a nurse. She thinks she has a vaginal infection and wants to know if there is an OTC medication she can use or does she need a prescription. Please call back

## 2017-12-10 ENCOUNTER — Encounter: Payer: BLUE CROSS/BLUE SHIELD | Admitting: Advanced Practice Midwife

## 2017-12-19 ENCOUNTER — Telehealth: Payer: Self-pay

## 2017-12-19 NOTE — Telephone Encounter (Signed)
Called PT and asked had anyone contacted her about her concern of a vaginal infection, she stated no but she asked the pharmacist and they told her to get the OTC Monistat. Explained to pt that any time that she has  any itching & d/c she can come to do a Nurse visit to do a self swab.But if theres any pain or bleeding to make in a appt.Pt verbalized understanding.

## 2017-12-24 ENCOUNTER — Encounter: Payer: Self-pay | Admitting: Advanced Practice Midwife

## 2017-12-24 ENCOUNTER — Ambulatory Visit (INDEPENDENT_AMBULATORY_CARE_PROVIDER_SITE_OTHER): Payer: BLUE CROSS/BLUE SHIELD | Admitting: Advanced Practice Midwife

## 2017-12-24 VITALS — BP 102/61 | HR 95 | Wt 180.2 lb

## 2017-12-24 DIAGNOSIS — Z3480 Encounter for supervision of other normal pregnancy, unspecified trimester: Secondary | ICD-10-CM

## 2017-12-24 DIAGNOSIS — Z3483 Encounter for supervision of other normal pregnancy, third trimester: Secondary | ICD-10-CM

## 2017-12-24 NOTE — Progress Notes (Signed)
   PRENATAL VISIT NOTE  Subjective:  Andrea [name] is a 32 y.o. G2P1001 at 2766w0d being seen today for ongoing prenatal care.  [name] currently monitored for the following issues for this low-risk pregnancy and has Supervision of other normal pregnancy, antepartum and Uterine size date discrepancy pregnancy, second trimester on their problem list.  Patient reports occasional contractions throughout the day. .  Contractions: Regular. Vag. Bleeding: None.  Movement: Present. Denies leaking of fluid.   The following portions of the patient's history were reviewed and updated as appropriate: allergies, current medications, past family history, past medical history, past social history, past surgical history and problem list. Problem list updated.  Objective:   Vitals:   12/24/17 0834  BP: 102/61  Pulse: 95  Weight: 180 lb 3.2 oz (81.7 kg)    Fetal Status: Fetal Heart Rate (bpm): 147 Fundal Height: 34 cm Movement: Present     General:  Alert, oriented and cooperative. Patient is in no acute distress.  Skin: Skin is warm and dry. No rash noted.   Cardiovascular: Normal heart rate noted  Respiratory: Normal respiratory effort, no problems with respiration noted  Abdomen: Soft, gravid, appropriate for gestational age.  Pain/Pressure: Present     Pelvic: Cervical exam deferred        Extremities: Normal range of motion.  Edema: None  Mental Status:  Normal mood and affect. Normal behavior. Normal judgment and thought content.   Assessment and Plan:  Pregnancy: G2P1001 at 1266w0d  1. Supervision of other normal pregnancy, antepartum - Routine care -GBS at next visit  Preterm labor symptoms and general obstetric precautions including but not limited to vaginal bleeding, contractions, leaking of fluid and fetal movement were reviewed in detail with the patient. Please refer to After Visit Summary for other counseling recommendations.  Return in about 2 weeks (around 01/07/2018).   Thressa ShellerHeather  Raidon Swanner, CNM

## 2017-12-24 NOTE — Progress Notes (Signed)
Pt is having contractions every 10 min

## 2017-12-24 NOTE — Patient Instructions (Signed)
AREA PEDIATRIC/FAMILY PRACTICE PHYSICIANS  [facility] [address], [address] [place], [place]  [address] Phone - [phone]   Fax - [phone]  [facility] [address] [place], [place] [address] Phone - [phone]   Fax - [phone]  [facility] AM[facility] [address] [place], [place]  [address] Phone - [phone]   Fax - [phone]  [facility] [address] [place], [place]  [address] Phone - [phone]   Fax - [phone]  [facility] [address] [place], [place]  [address] Phone - [phone]   Fax - [phone]  [facility] [address], [address] [place], [place]  [address] Phone - [phone]   Fax - [phone]  [facility] [address] [place], [place]  [address] Phone - [phone]   Fax - [phone]  [facility] [address], [address] [place], [place]  [address] Phone - [phone]   Fax - [phone]  [facility] [address] [place], [place]  [address] Phone - [phone]   Fax - [phone] [facility] [address] [place], [place]  [address] Phone - [phone]   Fax - [phone]  [facility] [address] [place], [place]  [address] Phone - [phone]   Fax - [phone]  [facility] [address] [place], [place]  [address] Phone - [phone]   Fax - [phone]  [facility] [address], [address] [place], [place]  [address] Phone - [phone]   Fax - [phone]  SHILPA GOSRANI [address] [place], [place]  [address] Phone - [phone]  [facility] [address] [place], [place]  [address] Phone - [phone]   Fax - [phone]  [facility] [address] [place], [place]  [address] Phone - [phone]   Fax - [phone]  [facility] [address] [place], [place]  [address] Phone - [phone]   Fax - [phone]  [facility] [address] [place], [place]  [address] Phone - [phone]   Fax - [phone]   [facility] [address] [place], [place]  [address] Phone - [phone]   Fax - [phone]  [facility] [address] [place], [place]  [address] Phone - [phone]   Fax - [phone]  DAVID RUBIN [address] [place], [place]  [address] Phone - [phone]   Fax - [phone]  [facility] [address] [place], [place]  [address] Phone - [phone]   Fax - [phone]  [facility] [address] , [place]  [address] Phone - [phone]   Fax - [phone] [facility] [address] [place], [place]  [address] Phone - [phone]   Fax - [phone]  [facility] [address] [place],   [address] Phone - [phone]   Fax - [phone]   [facility] [address] [place],   [address] Phone - [phone]   Fax - [phone]  [facility] Charlene Flemming MD [address] [place]  [address] Phone [phone]  Fax [phone]   

## 2018-01-07 ENCOUNTER — Ambulatory Visit (INDEPENDENT_AMBULATORY_CARE_PROVIDER_SITE_OTHER): Payer: BLUE CROSS/BLUE SHIELD | Admitting: Advanced Practice Midwife

## 2018-01-07 VITALS — BP 118/63 | HR 63 | Wt 181.2 lb

## 2018-01-07 DIAGNOSIS — Z3483 Encounter for supervision of other normal pregnancy, third trimester: Secondary | ICD-10-CM

## 2018-01-07 DIAGNOSIS — Z113 Encounter for screening for infections with a predominantly sexual mode of transmission: Secondary | ICD-10-CM | POA: Diagnosis not present

## 2018-01-07 LAB — OB RESULTS CONSOLE GC/CHLAMYDIA: Gonorrhea: NEGATIVE

## 2018-01-07 NOTE — Patient Instructions (Signed)
Vaginal delivery means that you will give birth by pushing your baby out of your birth canal (vagina). A team of health care providers will help you before, during, and after vaginal delivery. Birth experiences are unique for every woman and every pregnancy, and birth experiences vary depending on where you choose to give birth. What should I do to prepare for my baby's birth? Before your baby is born, it is important to talk with your health care provider about:  Your labor and delivery preferences. These may include: ? Medicines that you may be given. ? How you will manage your pain. This might include non-medical pain relief techniques or injectable pain relief such as epidural analgesia. ? How you and your baby will be monitored during labor and delivery. ? Who may be in the labor and delivery room with you. ? Your feelings about surgical delivery of your baby (cesarean delivery, or C-section) if this becomes necessary. ? Your feelings about receiving donated blood through an IV tube (blood transfusion) if this becomes necessary.  Whether you are able: ? To take pictures or videos of the birth. ? To eat during labor and delivery. ? To move around, walk, or change positions during labor and delivery.  What to expect after your baby is born, such as: ? Whether delayed umbilical cord clamping and cutting is offered. ? Who will care for your baby right after birth. ? Medicines or tests that may be recommended for your baby. ? Whether breastfeeding is supported in your hospital or birth center. ? How long you will be in the hospital or birth center.  How any medical conditions you have may affect your baby or your labor and delivery experience.  To prepare for your baby's birth, you should also:  Attend all of your health care visits before delivery (prenatal visits) as recommended by your health care provider. This is important.  Prepare your home for your baby's arrival. Make sure  that you have: ? Diapers. ? Baby clothing. ? Feeding equipment. ? Safe sleeping arrangements for you and your baby.  Install a car seat in your vehicle. Have your car seat checked by a certified car seat installer to make sure that it is installed safely.  Think about who will help you with your new baby at home for at least the first several weeks after delivery.  What can I expect when I arrive at the birth center or hospital? Once you are in labor and have been admitted into the hospital or birth center, your health care provider may:  Review your pregnancy history and any concerns you have.  Insert an IV tube into one of your veins. This is used to give you fluids and medicines.  Check your blood pressure, pulse, temperature, and heart rate (vital signs).  Check whether your bag of water (amniotic sac) has broken (ruptured).  Talk with you about your birth plan and discuss pain control options.  Monitoring Your health care provider may monitor your contractions (uterine monitoring) and your baby's heart rate (fetal monitoring). You may need to be monitored:  Often, but not continuously (intermittently).  All the time or for long periods at a time (continuously). Continuous monitoring may be needed if: ? You are taking certain medicines, such as medicine to relieve pain or make your contractions stronger. ? You have pregnancy or labor complications.  Monitoring may be done by:  Placing a special stethoscope or a handheld monitoring device on your abdomen to check your   baby's heartbeat, and feeling your abdomen for contractions. This method of monitoring does not continuously record your baby's heartbeat or your contractions.  Placing monitors on your abdomen (external monitors) to record your baby's heartbeat and the frequency and length of contractions. You may not have to wear external monitors all the time.  Placing monitors inside of your uterus (internal monitors) to  record your baby's heartbeat and the frequency, length, and strength of your contractions. ? Your health care provider may use internal monitors if he or she needs more information about the strength of your contractions or your baby's heart rate. ? Internal monitors are put in place by passing a thin, flexible wire through your vagina and into your uterus. Depending on the type of monitor, it may remain in your uterus or on your baby's head until birth. ? Your health care provider will discuss the benefits and risks of internal monitoring with you and will ask for your permission before inserting the monitors.  Telemetry. This is a type of continuous monitoring that can be done with external or internal monitors. Instead of having to stay in bed, you are able to move around during telemetry. Ask your health care provider if telemetry is an option for you.  Physical exam Your health care provider may perform a physical exam. This may include:  Checking whether your baby is positioned: ? With the head toward your vagina (head-down). This is most common. ? With the head toward the top of your uterus (head-up or breech). If your baby is in a breech position, your health care provider may try to turn your baby to a head-down position so you can deliver vaginally. If it does not seem that your baby can be born vaginally, your provider may recommend surgery to deliver your baby. In rare cases, you may be able to deliver vaginally if your baby is head-up (breech delivery). ? Lying sideways (transverse). Babies that are lying sideways cannot be delivered vaginally.  Checking your cervix to determine: ? Whether it is thinning out (effacing). ? Whether it is opening up (dilating). ? How low your baby has moved into your birth canal.  What are the three stages of labor and delivery?  Normal labor and delivery is divided into the following three stages: Stage 1  Stage 1 is the longest stage of labor,  and it can last for hours or days. Stage 1 includes: ? Early labor. This is when contractions may be irregular, or regular and mild. Generally, early labor contractions are more than 10 minutes apart. ? Active labor. This is when contractions get longer, more regular, more frequent, and more intense. ? The transition phase. This is when contractions happen very close together, are very intense, and may last longer than during any other part of labor.  Contractions generally feel mild, infrequent, and irregular at first. They get stronger, more frequent (about every 2-3 minutes), and more regular as you progress from early labor through active labor and transition.  Many women progress through stage 1 naturally, but you may need help to continue making progress. If this happens, your health care provider may talk with you about: ? Rupturing your amniotic sac if it has not ruptured yet. ? Giving you medicine to help make your contractions stronger and more frequent.  Stage 1 ends when your cervix is completely dilated to 4 inches (10 cm) and completely effaced. This happens at the end of the transition phase. Stage 2  Once your cervix   is completely effaced and dilated to 4 inches (10 cm), you may start to feel an urge to push. It is common for the body to naturally take a rest before feeling the urge to push, especially if you received an epidural or certain other pain medicines. This rest period may last for up to 1-2 hours, depending on your unique labor experience.  During stage 2, contractions are generally less painful, because pushing helps relieve contraction pain. Instead of contraction pain, you may feel stretching and burning pain, especially when the widest part of your baby's head passes through the vaginal opening (crowning).  Your health care provider will closely monitor your pushing progress and your baby's progress through the vagina during stage 2.  Your health care provider may  massage the area of skin between your vaginal opening and anus (perineum) or apply warm compresses to your perineum. This helps it stretch as the baby's head starts to crown, which can help prevent perineal tearing. ? In some cases, an incision may be made in your perineum (episiotomy) to allow the baby to pass through the vaginal opening. An episiotomy helps to make the opening of the vagina larger to allow more room for the baby to fit through.  It is very important to breathe and focus so your health care provider can control the delivery of your baby's head. Your health care provider may have you decrease the intensity of your pushing, to help prevent perineal tearing.  After delivery of your baby's head, the shoulders and the rest of the body generally deliver very quickly and without difficulty.  Once your baby is delivered, the umbilical cord may be cut right away, or this may be delayed for 1-2 minutes, depending on your baby's health. This may vary among health care providers, hospitals, and birth centers.  If you and your baby are healthy enough, your baby may be placed on your chest or abdomen to help maintain the baby's temperature and to help you bond with each other. Some mothers and babies start breastfeeding at this time. Your health care team will dry your baby and help keep your baby warm during this time.  Your baby may need immediate care if he or she: ? Showed signs of distress during labor. ? Has a medical condition. ? Was born too early (prematurely). ? Had a bowel movement before birth (meconium). ? Shows signs of difficulty transitioning from being inside the uterus to being outside of the uterus. If you are planning to breastfeed, your health care team will help you begin a feeding. Stage 3  The third stage of labor starts immediately after the birth of your baby and ends after you deliver the placenta. The placenta is an organ that develops during pregnancy to provide  oxygen and nutrients to your baby in the womb.  Delivering the placenta may require some pushing, and you may have mild contractions. Breastfeeding can stimulate contractions to help you deliver the placenta.  After the placenta is delivered, your uterus should tighten (contract) and become firm. This helps to stop bleeding in your uterus. To help your uterus contract and to control bleeding, your health care provider may: ? Give you medicine by injection, through an IV tube, by mouth, or through your rectum (rectally). ? Massage your abdomen or perform a vaginal exam to remove any blood clots that are left in your uterus. ? Empty your bladder by placing a thin, flexible tube (catheter) into your bladder. ? Encourage you to   breastfeed your baby. After labor is over, you and your baby will be monitored closely to ensure that you are both healthy until you are ready to go home. Your health care team will teach you how to care for yourself and your baby. This information is not intended to replace advice given to you by your health care provider. Make sure you discuss any questions you have with your health care provider. Document Released: 08/22/2008 Document Revised: 06/02/2016 Document Reviewed: 11/28/2015 Elsevier Interactive Patient Education  2018 [employer] Inc[employer]  

## 2018-01-07 NOTE — Progress Notes (Signed)
   PRENATAL VISIT NOTE  Subjective:  Andrea [name] is a 32 y.o. G2P1001 at 5560w0d being seen today for ongoing prenatal care.  [name] currently monitored for the following issues for this low-risk pregnancy and has Supervision of other normal pregnancy, antepartum and Uterine size date discrepancy pregnancy, second trimester on their problem list.  Patient reports some contractions off and on. .  Contractions: Irregular. Vag. Bleeding: None.  Movement: Present. Denies leaking of fluid.   The following portions of the patient's history were reviewed and updated as appropriate: allergies, current medications, past family history, past medical history, past social history, past surgical history and problem list. Problem list updated.  Objective:   Vitals:   01/07/18 0906  BP: 118/63  Pulse: 63  Weight: 181 lb 3.2 oz (82.2 kg)    Fetal Status: Fetal Heart Rate (bpm): 140 Fundal Height: 36 cm Movement: Present     General:  Alert, oriented and cooperative. Patient is in no acute distress.  Skin: Skin is warm and dry. No rash noted.   Cardiovascular: Normal heart rate noted  Respiratory: Normal respiratory effort, no problems with respiration noted  Abdomen: Soft, gravid, appropriate for gestational age.  Pain/Pressure: Absent     Pelvic: Cervical exam performed Dilation: Closed Effacement (%): Thick Station: Ballotable  Extremities: Normal range of motion.  Edema: None  Mental Status:  Normal mood and affect. Normal behavior. Normal judgment and thought content.   Assessment and Plan:  Pregnancy: G2P1001 at 5160w0d  1. Encounter for supervision of other normal pregnancy in third trimester - Strep Gp B NAA - Cervicovaginal ancillary only  Term labor symptoms and general obstetric precautions including but not limited to vaginal bleeding, contractions, leaking of fluid and fetal movement were reviewed in detail with the patient. Please refer to After Visit Summary for other counseling  recommendations.  Return in about 1 week (around 01/14/2018).   Thressa ShellerHeather Ion Gonnella, CNM

## 2018-01-07 NOTE — Progress Notes (Signed)
36 week cx today 

## 2018-01-08 LAB — CERVICOVAGINAL ANCILLARY ONLY
CHLAMYDIA, DNA PROBE: NEGATIVE
Neisseria Gonorrhea: NEGATIVE

## 2018-01-09 LAB — STREP GP B NAA: STREP GROUP B AG: POSITIVE — AB

## 2018-01-14 ENCOUNTER — Encounter: Payer: Self-pay | Admitting: Advanced Practice Midwife

## 2018-01-14 ENCOUNTER — Ambulatory Visit (INDEPENDENT_AMBULATORY_CARE_PROVIDER_SITE_OTHER): Payer: BLUE CROSS/BLUE SHIELD | Admitting: Advanced Practice Midwife

## 2018-01-14 DIAGNOSIS — Z3483 Encounter for supervision of other normal pregnancy, third trimester: Secondary | ICD-10-CM

## 2018-01-14 DIAGNOSIS — Z3480 Encounter for supervision of other normal pregnancy, unspecified trimester: Secondary | ICD-10-CM

## 2018-01-14 NOTE — Patient Instructions (Signed)
Vaginal delivery means that you will give birth by pushing your baby out of your birth canal (vagina). A team of health care providers will help you before, during, and after vaginal delivery. Birth experiences are unique for every woman and every pregnancy, and birth experiences vary depending on where you choose to give birth. What should I do to prepare for my baby's birth? Before your baby is born, it is important to talk with your health care provider about:  Your labor and delivery preferences. These may include: ? Medicines that you may be given. ? How you will manage your pain. This might include non-medical pain relief techniques or injectable pain relief such as epidural analgesia. ? How you and your baby will be monitored during labor and delivery. ? Who may be in the labor and delivery room with you. ? Your feelings about surgical delivery of your baby (cesarean delivery, or C-section) if this becomes necessary. ? Your feelings about receiving donated blood through an IV tube (blood transfusion) if this becomes necessary.  Whether you are able: ? To take pictures or videos of the birth. ? To eat during labor and delivery. ? To move around, walk, or change positions during labor and delivery.  What to expect after your baby is born, such as: ? Whether delayed umbilical cord clamping and cutting is offered. ? Who will care for your baby right after birth. ? Medicines or tests that may be recommended for your baby. ? Whether breastfeeding is supported in your hospital or birth center. ? How long you will be in the hospital or birth center.  How any medical conditions you have may affect your baby or your labor and delivery experience.  To prepare for your baby's birth, you should also:  Attend all of your health care visits before delivery (prenatal visits) as recommended by your health care provider. This is important.  Prepare your home for your baby's arrival. Make sure  that you have: ? Diapers. ? Baby clothing. ? Feeding equipment. ? Safe sleeping arrangements for you and your baby.  Install a car seat in your vehicle. Have your car seat checked by a certified car seat installer to make sure that it is installed safely.  Think about who will help you with your new baby at home for at least the first several weeks after delivery.  What can I expect when I arrive at the birth center or hospital? Once you are in labor and have been admitted into the hospital or birth center, your health care provider may:  Review your pregnancy history and any concerns you have.  Insert an IV tube into one of your veins. This is used to give you fluids and medicines.  Check your blood pressure, pulse, temperature, and heart rate (vital signs).  Check whether your bag of water (amniotic sac) has broken (ruptured).  Talk with you about your birth plan and discuss pain control options.  Monitoring Your health care provider may monitor your contractions (uterine monitoring) and your baby's heart rate (fetal monitoring). You may need to be monitored:  Often, but not continuously (intermittently).  All the time or for long periods at a time (continuously). Continuous monitoring may be needed if: ? You are taking certain medicines, such as medicine to relieve pain or make your contractions stronger. ? You have pregnancy or labor complications.  Monitoring may be done by:  Placing a special stethoscope or a handheld monitoring device on your abdomen to check your   baby's heartbeat, and feeling your abdomen for contractions. This method of monitoring does not continuously record your baby's heartbeat or your contractions.  Placing monitors on your abdomen (external monitors) to record your baby's heartbeat and the frequency and length of contractions. You may not have to wear external monitors all the time.  Placing monitors inside of your uterus (internal monitors) to  record your baby's heartbeat and the frequency, length, and strength of your contractions. ? Your health care provider may use internal monitors if he or she needs more information about the strength of your contractions or your baby's heart rate. ? Internal monitors are put in place by passing a thin, flexible wire through your vagina and into your uterus. Depending on the type of monitor, it may remain in your uterus or on your baby's head until birth. ? Your health care provider will discuss the benefits and risks of internal monitoring with you and will ask for your permission before inserting the monitors.  Telemetry. This is a type of continuous monitoring that can be done with external or internal monitors. Instead of having to stay in bed, you are able to move around during telemetry. Ask your health care provider if telemetry is an option for you.  Physical exam Your health care provider may perform a physical exam. This may include:  Checking whether your baby is positioned: ? With the head toward your vagina (head-down). This is most common. ? With the head toward the top of your uterus (head-up or breech). If your baby is in a breech position, your health care provider may try to turn your baby to a head-down position so you can deliver vaginally. If it does not seem that your baby can be born vaginally, your provider may recommend surgery to deliver your baby. In rare cases, you may be able to deliver vaginally if your baby is head-up (breech delivery). ? Lying sideways (transverse). Babies that are lying sideways cannot be delivered vaginally.  Checking your cervix to determine: ? Whether it is thinning out (effacing). ? Whether it is opening up (dilating). ? How low your baby has moved into your birth canal.  What are the three stages of labor and delivery?  Normal labor and delivery is divided into the following three stages: Stage 1  Stage 1 is the longest stage of labor,  and it can last for hours or days. Stage 1 includes: ? Early labor. This is when contractions may be irregular, or regular and mild. Generally, early labor contractions are more than 10 minutes apart. ? Active labor. This is when contractions get longer, more regular, more frequent, and more intense. ? The transition phase. This is when contractions happen very close together, are very intense, and may last longer than during any other part of labor.  Contractions generally feel mild, infrequent, and irregular at first. They get stronger, more frequent (about every 2-3 minutes), and more regular as you progress from early labor through active labor and transition.  Many women progress through stage 1 naturally, but you may need help to continue making progress. If this happens, your health care provider may talk with you about: ? Rupturing your amniotic sac if it has not ruptured yet. ? Giving you medicine to help make your contractions stronger and more frequent.  Stage 1 ends when your cervix is completely dilated to 4 inches (10 cm) and completely effaced. This happens at the end of the transition phase. Stage 2  Once your cervix   is completely effaced and dilated to 4 inches (10 cm), you may start to feel an urge to push. It is common for the body to naturally take a rest before feeling the urge to push, especially if you received an epidural or certain other pain medicines. This rest period may last for up to 1-2 hours, depending on your unique labor experience.  During stage 2, contractions are generally less painful, because pushing helps relieve contraction pain. Instead of contraction pain, you may feel stretching and burning pain, especially when the widest part of your baby's head passes through the vaginal opening (crowning).  Your health care provider will closely monitor your pushing progress and your baby's progress through the vagina during stage 2.  Your health care provider may  massage the area of skin between your vaginal opening and anus (perineum) or apply warm compresses to your perineum. This helps it stretch as the baby's head starts to crown, which can help prevent perineal tearing. ? In some cases, an incision may be made in your perineum (episiotomy) to allow the baby to pass through the vaginal opening. An episiotomy helps to make the opening of the vagina larger to allow more room for the baby to fit through.  It is very important to breathe and focus so your health care provider can control the delivery of your baby's head. Your health care provider may have you decrease the intensity of your pushing, to help prevent perineal tearing.  After delivery of your baby's head, the shoulders and the rest of the body generally deliver very quickly and without difficulty.  Once your baby is delivered, the umbilical cord may be cut right away, or this may be delayed for 1-2 minutes, depending on your baby's health. This may vary among health care providers, hospitals, and birth centers.  If you and your baby are healthy enough, your baby may be placed on your chest or abdomen to help maintain the baby's temperature and to help you bond with each other. Some mothers and babies start breastfeeding at this time. Your health care team will dry your baby and help keep your baby warm during this time.  Your baby may need immediate care if he or she: ? Showed signs of distress during labor. ? Has a medical condition. ? Was born too early (prematurely). ? Had a bowel movement before birth (meconium). ? Shows signs of difficulty transitioning from being inside the uterus to being outside of the uterus. If you are planning to breastfeed, your health care team will help you begin a feeding. Stage 3  The third stage of labor starts immediately after the birth of your baby and ends after you deliver the placenta. The placenta is an organ that develops during pregnancy to provide  oxygen and nutrients to your baby in the womb.  Delivering the placenta may require some pushing, and you may have mild contractions. Breastfeeding can stimulate contractions to help you deliver the placenta.  After the placenta is delivered, your uterus should tighten (contract) and become firm. This helps to stop bleeding in your uterus. To help your uterus contract and to control bleeding, your health care provider may: ? Give you medicine by injection, through an IV tube, by mouth, or through your rectum (rectally). ? Massage your abdomen or perform a vaginal exam to remove any blood clots that are left in your uterus. ? Empty your bladder by placing a thin, flexible tube (catheter) into your bladder. ? Encourage you to   breastfeed your baby. After labor is over, you and your baby will be monitored closely to ensure that you are both healthy until you are ready to go home. Your health care team will teach you how to care for yourself and your baby. This information is not intended to replace advice given to you by your health care provider. Make sure you discuss any questions you have with your health care provider. Document Released: 08/22/2008 Document Revised: 06/02/2016 Document Reviewed: 11/28/2015 Elsevier Interactive Patient Education  2018 [employer] Inc[employer]  

## 2018-01-14 NOTE — Progress Notes (Signed)
Pt states contractions are every 5 mins, orig started 2 weeks ago

## 2018-01-14 NOTE — Progress Notes (Signed)
   PRENATAL VISIT NOTE  Subjective:  Andrea [name] is a 32 y.o. G2P1001 at 471w0d being seen today for ongoing prenatal care.  [name] currently monitored for the following issues for this low-risk pregnancy and has Supervision of other normal pregnancy, antepartum and Uterine size date discrepancy pregnancy, second trimester on their problem list.  Patient reports no complaints.  Contractions: Regular, but not painful at this time.  Vag. Bleeding: Bloody Show.  Movement: Present. Denies leaking of fluid.   The following portions of the patient's history were reviewed and updated as appropriate: allergies, current medications, past family history, past medical history, past social history, past surgical history and problem list. Problem list updated.  Objective:   Vitals:   01/14/18 0858  BP: 105/78  Pulse: 90  Weight: 184 lb 8 oz (83.7 kg)    Fetal Status: Fetal Heart Rate (bpm): 135 Fundal Height: 37 cm Movement: Present     General:  Alert, oriented and cooperative. Patient is in no acute distress.  Skin: Skin is warm and dry. No rash noted.   Cardiovascular: Normal heart rate noted  Respiratory: Normal respiratory effort, no problems with respiration noted  Abdomen: Soft, gravid, appropriate for gestational age.  Pain/Pressure: Absent     Pelvic: Cervical exam deferred        Extremities: Normal range of motion.  Edema: None  Mental Status:  Normal mood and affect. Normal behavior. Normal judgment and thought content.   Assessment and Plan:  Pregnancy: G2P1001 at 171w0d  1. Supervision of other normal pregnancy, antepartum - GBS positive - Routine care  Term labor symptoms and general obstetric precautions including but not limited to vaginal bleeding, contractions, leaking of fluid and fetal movement were reviewed in detail with the patient. Please refer to After Visit Summary for other counseling recommendations.  Return in about 1 week (around 01/21/2018).   Thressa ShellerHeather  Hogan, CNM

## 2018-01-21 ENCOUNTER — Encounter: Payer: Self-pay | Admitting: Advanced Practice Midwife

## 2018-01-21 ENCOUNTER — Ambulatory Visit (INDEPENDENT_AMBULATORY_CARE_PROVIDER_SITE_OTHER): Payer: BLUE CROSS/BLUE SHIELD | Admitting: Advanced Practice Midwife

## 2018-01-21 VITALS — BP 108/71 | HR 91 | Wt 185.4 lb

## 2018-01-21 DIAGNOSIS — Z3483 Encounter for supervision of other normal pregnancy, third trimester: Secondary | ICD-10-CM

## 2018-01-21 DIAGNOSIS — Z3480 Encounter for supervision of other normal pregnancy, unspecified trimester: Secondary | ICD-10-CM

## 2018-01-21 NOTE — Progress Notes (Signed)
   PRENATAL VISIT NOTE  Subjective:  Layloni A Bernette RedbirdFargalla is a 32 y.o. G2P1001 at 3932w0d being seen today for ongoing prenatal care.  [name] currently monitored for the following issues for this low-risk pregnancy and has Supervision of other normal pregnancy, antepartum and Uterine size date discrepancy pregnancy, second trimester on their problem list.  Patient reports no complaints.  Contractions: Irregular. Vag. Bleeding: None.  Movement: Present. Denies leaking of fluid.   The following portions of the patient's history were reviewed and updated as appropriate: allergies, current medications, past family history, past medical history, past social history, past surgical history and problem list. Problem list updated.  Objective:   Vitals:   01/21/18 0902  BP: 108/71  Pulse: 91  Weight: 185 lb 6.4 oz (84.1 kg)    Fetal Status: Fetal Heart Rate (bpm): 120 Fundal Height: 38 cm Movement: Present     General:  Alert, oriented and cooperative. Patient is in no acute distress.  Skin: Skin is warm and dry. No rash noted.   Cardiovascular: Normal heart rate noted  Respiratory: Normal respiratory effort, no problems with respiration noted  Abdomen: Soft, gravid, appropriate for gestational age.  Pain/Pressure: Present     Pelvic: Cervical exam deferred        Extremities: Normal range of motion.  Edema: None  Mental Status:  Normal mood and affect. Normal behavior. Normal judgment and thought content.   Assessment and Plan:  Pregnancy: G2P1001 at 7232w0d  1. Supervision of other normal pregnancy, antepartum - Routine care - Discussed post-dates management with the patient and husband today. They are not necessarily happy with possibly going to 41 weeks. She had IOL at 40 weeks with her first baby because "she showed up at the hospital, and they just kept her for induction." There are some social stressors at play as they do report there do not have family or much help in the area for when labor  starts. So, both are anxious about trying to plan for the unexpected. Reassurance and support provided.  - Contraception discussed. Considering liletta. Has had Nexplanon in the past, and was not happy with bleeding. However, considering it again.  Term labor symptoms and general obstetric precautions including but not limited to vaginal bleeding, contractions, leaking of fluid and fetal movement were reviewed in detail with the patient. Please refer to After Visit Summary for other counseling recommendations.  Return in about 1 week (around 01/28/2018).   Thressa ShellerHeather Dametri Ozburn, CNM

## 2018-01-21 NOTE — Patient Instructions (Addendum)
Contraception Choices Contraception, also called birth control, refers to methods or devices that prevent pregnancy. Hormonal methods Contraceptive implant A contraceptive implant is a thin, plastic tube that contains a hormone. It is inserted into the upper part of the arm. It can remain in place for up to 3 years. Progestin-only injections Progestin-only injections are injections of progestin, a synthetic form of the hormone progesterone. They are given every 3 months by a health care provider. Birth control pills Birth control pills are pills that contain hormones that prevent pregnancy. They must be taken once a day, preferably at the same time each day. Birth control patch The birth control patch contains hormones that prevent pregnancy. It is placed on the skin and must be changed once a week for three weeks and removed on the fourth week. A prescription is needed to use this method of contraception. Vaginal ring A vaginal ring contains hormones that prevent pregnancy. It is placed in the vagina for three weeks and removed on the fourth week. After that, the process is repeated with a new ring. A prescription is needed to use this method of contraception. Emergency contraceptive Emergency contraceptives prevent pregnancy after unprotected sex. They come in pill form and can be taken up to 5 days after sex. They work best the sooner they are taken after having sex. Most emergency contraceptives are available without a prescription. This method should not be used as your only form of birth control. Barrier methods Female condom A female condom is a thin sheath that is worn over the penis during sex. Condoms keep sperm from going inside a woman's body. They can be used with a spermicide to increase their effectiveness. They should be disposed after a single use. Female condom A female condom is a soft, loose-fitting sheath that is put into the vagina before sex. The condom keeps sperm from going  inside a woman's body. They should be disposed after a single use. Diaphragm A diaphragm is a soft, dome-shaped barrier. It is inserted into the vagina before sex, along with a spermicide. The diaphragm blocks sperm from entering the uterus, and the spermicide kills sperm. A diaphragm should be left in the vagina for 6-8 hours after sex and removed within 24 hours. A diaphragm is prescribed and fitted by a health care provider. A diaphragm should be replaced every 1-2 years, after giving birth, after gaining more than 15 lb (6.8 kg), and after pelvic surgery. Cervical cap A cervical cap is a round, soft latex or plastic cup that fits over the cervix. It is inserted into the vagina before sex, along with spermicide. It blocks sperm from entering the uterus. The cap should be left in place for 6-8 hours after sex and removed within 48 hours. A cervical cap must be prescribed and fitted by a health care provider. It should be replaced every 2 years. Sponge A sponge is a soft, circular piece of polyurethane foam with spermicide on it. The sponge helps block sperm from entering the uterus, and the spermicide kills sperm. To use it, you make it wet and then insert it into the vagina. It should be inserted before sex, left in for at least 6 hours after sex, and removed and thrown away within 30 hours. Spermicides Spermicides are chemicals that kill or block sperm from entering the cervix and uterus. They can come as a cream, jelly, suppository, foam, or tablet. A spermicide should be inserted into the vagina with an applicator at least 10-15 minutes before   sex to allow time for it to work. The process must be repeated every time you have sex. Spermicides do not require a prescription. Intrauterine contraception Intrauterine device (IUD) An IUD is a T-shaped device that is put in a woman's uterus. There are two types:  Hormone IUD.This type contains progestin, a synthetic form of the hormone progesterone. This  type can stay in place for 3-5 years.  Copper IUD.This type is wrapped in copper wire. It can stay in place for 10 years.  Permanent methods of contraception Female tubal ligation In this method, a woman's fallopian tubes are sealed, tied, or blocked during surgery to prevent eggs from traveling to the uterus. Hysteroscopic sterilization In this method, a small, flexible insert is placed into each fallopian tube. The inserts cause scar tissue to form in the fallopian tubes and block them, so sperm cannot reach an egg. The procedure takes about 3 months to be effective. Another form of birth control must be used during those 3 months. Female sterilization This is a procedure to tie off the tubes that carry sperm (vasectomy). After the procedure, the man can still ejaculate fluid (semen). Natural planning methods Natural family planning In this method, a couple does not have sex on days when the woman could become pregnant. Calendar method This means keeping track of the length of each menstrual cycle, identifying the days when pregnancy can happen, and not having sex on those days. Ovulation method In this method, a couple avoids sex during ovulation. Symptothermal method This method involves not having sex during ovulation. The woman typically checks for ovulation by watching changes in her temperature and in the consistency of cervical mucus. Post-ovulation method In this method, a couple waits to have sex until after ovulation. Summary  Contraception, also called birth control, means methods or devices that prevent pregnancy.  Hormonal methods of contraception include implants, injections, pills, patches, vaginal rings, and emergency contraceptives.  Barrier methods of contraception can include female condoms, female condoms, diaphragms, cervical caps, sponges, and spermicides.  There are two types of IUDs (intrauterine devices). An IUD can be put in a woman's uterus to prevent pregnancy  for 3-5 years.  Permanent sterilization can be done through a procedure for males, females, or both.  Natural family planning methods involve not having sex on days when the woman could become pregnant. This information is not intended to replace advice given to you by your health care provider. Make sure you discuss any questions you have with your health care provider. Document Released: 11/13/2005 Document Revised: 12/16/2016 Document Reviewed: 12/16/2016 Elsevier Interactive Patient Education  2018 [employer]  Safe Medications in Pregnancy   Colds/Coughs/Allergies: Benadryl (alcohol free) 25 mg every 6 hours as needed Breath right strips Claritin Cepacol throat lozenges Chloraseptic throat spray Cold-Eeze- up to three times per day Cough drops, alcohol free Flonase (by prescription only) Guaifenesin Mucinex Robitussin DM (plain only, alcohol free) Saline nasal spray/drops Sudafed (pseudoephedrine) & Actifed ** use only after [redacted] weeks gestation and if you do not have high blood pressure Tylenol Vicks Vaporub Zinc lozenges Zyrtec    **If taking multiple medications, please check labels to avoid duplicating the same active ingredients **take medication as directed on the label ** Do not exceed 4000 mg of tylenol in 24 hours **Do not take medications that contain aspirin or ibuprofen

## 2018-01-23 ENCOUNTER — Telehealth: Payer: Self-pay | Admitting: General Practice

## 2018-01-23 NOTE — Telephone Encounter (Signed)
Patient called and left message on nurse line stating she is [redacted] weeks pregnant and when she went to the bathroom she noticed brown discharge. Patient would like a call back. Called patient and she states she went to the bathroom and noticed a small amount of brown discharge. Told patient that can be normal towards the end of pregnancy. Patient asked if she needed to come in. Told patient she doesn't need to come in for that unless baby is not moving, she is bleeding like a period, or is in severe pain. Patient verbalized understanding and states so everything is fine I do not need to come in. Told patient she does not need to be seen as discharge is normal. Reviewed if she is having bleeding like a period or severe pain she should come to MAU. Patient states a friend told her she might be going into labor. Told patient sometimes the discharge could mean that or it may not. Patient asked when she would be going into labor. Told patient we do not know and the discharge may not be related to that at all. Told patient if she is having contractions every 4-5 minutes for several hours she should come in to MAU. Patient states she has contractions every 2-5 minutes but they go away. Told patient if contractions persist every 4-5 minutes for several hours she should come in otherwise stay at home. Patient states her contractions have been that far apart but they eventually stop. Phone call was disconnected.

## 2018-01-24 ENCOUNTER — Inpatient Hospital Stay (HOSPITAL_COMMUNITY): Payer: BLUE CROSS/BLUE SHIELD | Admitting: Anesthesiology

## 2018-01-24 ENCOUNTER — Encounter (HOSPITAL_COMMUNITY): Payer: Self-pay

## 2018-01-24 ENCOUNTER — Inpatient Hospital Stay (HOSPITAL_COMMUNITY)
Admission: AD | Admit: 2018-01-24 | Discharge: 2018-01-26 | DRG: 807 | Disposition: A | Discharge status: Home / Self Care | Payer: BLUE CROSS/BLUE SHIELD | Source: Ambulatory Visit | Attending: Obstetrics and Gynecology | Admitting: Obstetrics and Gynecology

## 2018-01-24 ENCOUNTER — Other Ambulatory Visit: Payer: Self-pay

## 2018-01-24 DIAGNOSIS — O99824 Streptococcus B carrier state complicating childbirth: Secondary | ICD-10-CM | POA: Diagnosis not present

## 2018-01-24 DIAGNOSIS — Z3483 Encounter for supervision of other normal pregnancy, third trimester: Secondary | ICD-10-CM | POA: Diagnosis not present

## 2018-01-24 DIAGNOSIS — Z3A38 38 weeks gestation of pregnancy: Secondary | ICD-10-CM

## 2018-01-24 DIAGNOSIS — O479 False labor, unspecified: Secondary | ICD-10-CM

## 2018-01-24 HISTORY — DX: False labor, unspecified: O47.9

## 2018-01-24 LAB — COMPREHENSIVE METABOLIC PANEL
ALBUMIN: 2.8 g/dL — AB (ref 3.5–5.0)
ALK PHOS: 110 U/L (ref 38–126)
ALT: 16 U/L (ref 14–54)
AST: 23 U/L (ref 15–41)
Anion gap: 10 (ref 5–15)
BUN: 14 mg/dL (ref 6–20)
CALCIUM: 8.7 mg/dL — AB (ref 8.9–10.3)
CO2: 18 mmol/L — AB (ref 22–32)
CREATININE: 0.59 mg/dL (ref 0.44–1.00)
Chloride: 106 mmol/L (ref 101–111)
GFR calc Af Amer: >60 mL/min (ref >60)
GFR calc non Af Amer: >60 mL/min (ref >60)
GLUCOSE: 97 mg/dL (ref 65–99)
Potassium: 4.2 mmol/L (ref 3.5–5.1)
SODIUM: 134 mmol/L — AB (ref 135–145)
Total Bilirubin: 0.5 mg/dL (ref 0.3–1.2)
Total Protein: 6.3 g/dL — ABNORMAL LOW (ref 6.5–8.1)

## 2018-01-24 LAB — CBC
HCT: 41 % (ref 36.0–46.0)
Hemoglobin: 13.8 g/dL (ref 12.0–15.0)
MCH: 29.7 pg (ref 26.0–34.0)
MCHC: 33.7 g/dL (ref 30.0–36.0)
MCV: 88.2 fL (ref 78.0–100.0)
PLATELETS: 168 10*3/uL (ref 150–400)
RBC: 4.65 MIL/uL (ref 3.87–5.11)
RDW: 15 % (ref 11.5–15.5)
WBC: 6.1 10*3/uL (ref 4.0–10.5)

## 2018-01-24 LAB — RPR: RPR: NONREACTIVE

## 2018-01-24 LAB — TYPE AND SCREEN
ABO/RH(D): O POS
Antibody Screen: NEGATIVE

## 2018-01-24 LAB — POCT FERN TEST: POCT Fern Test: POSITIVE

## 2018-01-24 LAB — HIV ANTIBODY (ROUTINE TESTING W REFLEX): HIV SCREEN 4TH GENERATION: NONREACTIVE

## 2018-01-24 MED ORDER — BENZOCAINE-MENTHOL 20-0.5 % EX AERO
1.0000 "application " | INHALATION_SPRAY | CUTANEOUS | Status: DC | PRN
  Administered 2018-01-24: 1 via TOPICAL
  Filled 2018-01-24: qty 56

## 2018-01-24 MED ORDER — OXYCODONE-ACETAMINOPHEN 5-325 MG PO TABS: 2.0000 | ORAL_TABLET | ORAL | Status: DC | PRN

## 2018-01-24 MED ORDER — EPHEDRINE 5 MG/ML INJ
10.0000 mg | INTRAVENOUS | Status: DC | PRN
  Filled 2018-01-24: qty 2

## 2018-01-24 MED ORDER — OXYTOCIN 40 UNITS IN LACTATED RINGERS INFUSION - SIMPLE MED
2.5000 [IU]/h | INTRAVENOUS | Status: DC
  Filled 2018-01-24: qty 1000

## 2018-01-24 MED ORDER — COCONUT OIL OIL
1.0000 "application " | TOPICAL_OIL | Status: DC | PRN
  Filled 2018-01-24: qty 120

## 2018-01-24 MED ORDER — OXYTOCIN BOLUS FROM INFUSION
500.0000 mL | Freq: Once | INTRAVENOUS | Status: AC
  Administered 2018-01-24: 500 mL via INTRAVENOUS

## 2018-01-24 MED ORDER — LIDOCAINE HCL (PF) 1 % IJ SOLN
INTRAMUSCULAR | Status: DC | PRN
  Administered 2018-01-24 (×2): 5 mL

## 2018-01-24 MED ORDER — DIPHENHYDRAMINE HCL 25 MG PO CAPS: 25.0000 mg | ORAL_CAPSULE | Freq: Four times a day (QID) | ORAL | Status: DC | PRN

## 2018-01-24 MED ORDER — ONDANSETRON HCL 4 MG/2ML IJ SOLN: 4.0000 mg | INTRAMUSCULAR | Status: DC | PRN

## 2018-01-24 MED ORDER — ONDANSETRON HCL 4 MG/2ML IJ SOLN
4.0000 mg | Freq: Four times a day (QID) | INTRAMUSCULAR | Status: DC | PRN
Start: 2018-01-24 — End: 2018-01-24

## 2018-01-24 MED ORDER — LIDOCAINE HCL (PF) 1 % IJ SOLN
30.0000 mL | INTRAMUSCULAR | Status: DC | PRN
  Filled 2018-01-24: qty 30

## 2018-01-24 MED ORDER — WITCH HAZEL-GLYCERIN EX PADS
1.0000 | MEDICATED_PAD | CUTANEOUS | Status: DC | PRN
Start: 2018-01-24 — End: 2018-01-26
  Administered 2018-01-24 – 2018-01-25 (×2): 1 via TOPICAL

## 2018-01-24 MED ORDER — DIBUCAINE 1 % RE OINT: 1.0000 "application " | TOPICAL_OINTMENT | RECTAL | Status: DC | PRN

## 2018-01-24 MED ORDER — PHENYLEPHRINE 40 MCG/ML (10ML) SYRINGE FOR IV PUSH (FOR BLOOD PRESSURE SUPPORT)
80.0000 ug | PREFILLED_SYRINGE | INTRAVENOUS | Status: DC | PRN
  Filled 2018-01-24: qty 10
  Filled 2018-01-24: qty 5

## 2018-01-24 MED ORDER — LACTATED RINGERS IV SOLN: 500.0000 mL | Freq: Once | INTRAVENOUS | Status: DC

## 2018-01-24 MED ORDER — LACTATED RINGERS IV SOLN
INTRAVENOUS | Status: DC
  Administered 2018-01-24: 09:00:00 via INTRAVENOUS

## 2018-01-24 MED ORDER — PRENATAL MULTIVITAMIN CH
1.0000 | ORAL_TABLET | Freq: Every day | ORAL | Status: DC
  Administered 2018-01-25 – 2018-01-26 (×2): 1 via ORAL
  Filled 2018-01-24 (×2): qty 1

## 2018-01-24 MED ORDER — PHENYLEPHRINE 40 MCG/ML (10ML) SYRINGE FOR IV PUSH (FOR BLOOD PRESSURE SUPPORT)
80.0000 ug | PREFILLED_SYRINGE | INTRAVENOUS | Status: DC | PRN
  Filled 2018-01-24: qty 5

## 2018-01-24 MED ORDER — SOD CITRATE-CITRIC ACID 500-334 MG/5ML PO SOLN: 30.0000 mL | ORAL | Status: DC | PRN

## 2018-01-24 MED ORDER — SENNOSIDES-DOCUSATE SODIUM 8.6-50 MG PO TABS
2.0000 | ORAL_TABLET | ORAL | Status: DC
  Administered 2018-01-24: 2 via ORAL
  Filled 2018-01-24 (×2): qty 2

## 2018-01-24 MED ORDER — ZOLPIDEM TARTRATE 5 MG PO TABS: 5.0000 mg | ORAL_TABLET | Freq: Every evening | ORAL | Status: DC | PRN

## 2018-01-24 MED ORDER — AMPICILLIN SODIUM 2 G IJ SOLR
2.0000 g | Freq: Once | INTRAMUSCULAR | Status: AC
  Administered 2018-01-24: 2 g via INTRAVENOUS
  Filled 2018-01-24: qty 2

## 2018-01-24 MED ORDER — DIPHENHYDRAMINE HCL 50 MG/ML IJ SOLN: 12.5000 mg | INTRAMUSCULAR | Status: DC | PRN

## 2018-01-24 MED ORDER — OXYCODONE HCL 5 MG PO TABS
10.0000 mg | ORAL_TABLET | ORAL | Status: DC | PRN
  Administered 2018-01-25: 10 mg via ORAL

## 2018-01-24 MED ORDER — IBUPROFEN 600 MG PO TABS
600.0000 mg | ORAL_TABLET | Freq: Four times a day (QID) | ORAL | Status: DC
  Administered 2018-01-24 – 2018-01-26 (×9): 600 mg via ORAL
  Filled 2018-01-24 (×9): qty 1

## 2018-01-24 MED ORDER — ACETAMINOPHEN 325 MG PO TABS
650.0000 mg | ORAL_TABLET | ORAL | Status: DC | PRN
Start: 2018-01-24 — End: 2018-01-24

## 2018-01-24 MED ORDER — FENTANYL 2.5 MCG/ML BUPIVACAINE 1/10 % EPIDURAL INFUSION (WH - ANES)
14.0000 mL/h | INTRAMUSCULAR | Status: DC | PRN
  Administered 2018-01-24: 14 mL/h via EPIDURAL
  Filled 2018-01-24: qty 100

## 2018-01-24 MED ORDER — OXYCODONE HCL 5 MG PO TABS
5.0000 mg | ORAL_TABLET | ORAL | Status: DC | PRN
  Administered 2018-01-25 – 2018-01-26 (×3): 5 mg via ORAL
  Filled 2018-01-24 (×5): qty 1

## 2018-01-24 MED ORDER — OXYCODONE-ACETAMINOPHEN 5-325 MG PO TABS: 1.0000 | ORAL_TABLET | ORAL | Status: DC | PRN

## 2018-01-24 MED ORDER — TETANUS-DIPHTH-ACELL PERTUSSIS 5-2.5-18.5 LF-MCG/0.5 IM SUSP: 0.5000 mL | Freq: Once | INTRAMUSCULAR | Status: DC

## 2018-01-24 MED ORDER — SIMETHICONE 80 MG PO CHEW
80.0000 mg | CHEWABLE_TABLET | ORAL | Status: DC | PRN
Start: 2018-01-24 — End: 2018-01-26

## 2018-01-24 MED ORDER — LACTATED RINGERS IV SOLN
500.0000 mL | INTRAVENOUS | Status: DC | PRN
  Administered 2018-01-24: 500 mL via INTRAVENOUS
  Administered 2018-01-24: 250 mL via INTRAVENOUS

## 2018-01-24 MED ORDER — ACETAMINOPHEN 325 MG PO TABS
650.0000 mg | ORAL_TABLET | ORAL | Status: DC | PRN
  Administered 2018-01-24: 650 mg via ORAL
  Filled 2018-01-24: qty 2

## 2018-01-24 MED ORDER — ONDANSETRON HCL 4 MG PO TABS: 4.0000 mg | ORAL_TABLET | ORAL | Status: DC | PRN

## 2018-01-24 NOTE — Anesthesia Preprocedure Evaluation (Signed)

## 2018-01-24 NOTE — Progress Notes (Signed)
Patient ID: Andrea [name], female   DOB: 12/29/1985, 32 y.o.   MRN: [id number] Now has epidural . Vitals:   01/24/18 0610 01/24/18 0615 01/24/18 0620 01/24/18 0633  BP: 102/74 94/64 110/65 103/66  Pulse: (!) 103 79 92 84  Resp:      Temp:    98.6 F (37 C)  TempSrc:    Oral  SpO2:      Weight:      Height:       FHR reactive Category I  UCs every 4 min now, likely due to fluid bolus with epidural  Cervix progressing steadily  Dilation: 7 Effacement (%): 70 Cervical Position: Middle Station: -1 Presentation: Vertex Exam by:: B. parks, RN  Will anticipate SVD

## 2018-01-24 NOTE — Anesthesia Postprocedure Evaluation (Signed)
Anesthesia Post Note  Patient: Andrea [name]  Procedure(s) Performed: AN AD HOC LABOR EPIDURAL     Patient location during evaluation: Mother Baby Anesthesia Type: Epidural Level of consciousness: awake and alert Pain management: pain level controlled Vital Signs Assessment: post-procedure vital signs reviewed and stable Respiratory status: spontaneous breathing, nonlabored ventilation and respiratory function stable Cardiovascular status: stable Postop Assessment: no headache, no backache and epidural receding Anesthetic complications: no    Last Vitals:  Vitals:   01/24/18 1101 01/24/18 1130  BP:  (!) 106/57  Pulse:  88  Resp:  16  Temp: 37.2 C 37.3 C  SpO2:      Last Pain:  Vitals:   01/24/18 1130  TempSrc: Oral  PainSc:    Pain Goal: Patients Stated Pain Goal: 7 (01/24/18 0750)               Seaborn Nakama

## 2018-01-24 NOTE — Addendum Note (Signed)
Addendum  created 01/24/18 1402 by Graciela HusbandsFussell, Anjoli Diemer O, CRNA   Charge Capture section accepted, Sign clinical note

## 2018-01-24 NOTE — Anesthesia Procedure Notes (Signed)
Epidural Patient location during procedure: OB  Staffing Anesthesiologist: Yolunda Kloos, MD Performed: anesthesiologist   Preanesthetic Checklist Completed: patient identified, site marked, surgical consent, pre-op evaluation, timeout performed, IV checked, risks and benefits discussed and monitors and equipment checked  Epidural Patient position: sitting Prep: DuraPrep Patient monitoring: heart rate, continuous pulse ox and blood pressure Approach: right paramedian Location: L3-L4 Injection technique: LOR saline  Needle:  Needle type: Tuohy  Needle gauge: 17 G Needle length: 9 cm and 9 Needle insertion depth: 6 cm Catheter type: closed end flexible Catheter size: 20 Guage Catheter at skin depth: 10 cm Test dose: negative  Assessment Events: blood not aspirated, injection not painful, no injection resistance, negative IV test and no paresthesia  Additional Notes Patient identified. Risks/Benefits/Options discussed with patient including but not limited to bleeding, infection, nerve damage, paralysis, failed block, incomplete pain control, headache, blood pressure changes, nausea, vomiting, reactions to medication both or allergic, itching and postpartum back pain. Confirmed with bedside nurse the patient's most recent platelet count. Confirmed with patient that they are not currently taking any anticoagulation, have any bleeding history or any family history of bleeding disorders. Patient expressed understanding and wished to proceed. All questions were answered. Sterile technique was used throughout the entire procedure. Please see nursing notes for vital signs. Test dose was given through epidural needle and negative prior to continuing to dose epidural or start infusion. Warning signs of high block given to the patient including shortness of breath, tingling/numbness in hands, complete motor block, or any concerning symptoms with instructions to call for help. Patient was given  instructions on fall risk and not to get out of bed. All questions and concerns addressed with instructions to call with any issues.     

## 2018-01-24 NOTE — Anesthesia Postprocedure Evaluation (Signed)
Anesthesia Post Note  Patient: Andrea [name]  Procedure(s) Performed: AN AD HOC LABOR EPIDURAL     Patient location during evaluation: Mother Baby Anesthesia Type: Epidural Level of consciousness: awake and alert and oriented Pain management: satisfactory to patient Vital Signs Assessment: post-procedure vital signs reviewed and stable Respiratory status: respiratory function stable Cardiovascular status: stable Postop Assessment: no headache, no backache, epidural receding, patient able to bend at knees, no signs of nausea or vomiting and adequate PO intake Anesthetic complications: no    Last Vitals:  Vitals:   01/24/18 1130 01/24/18 1310  BP: (!) 106/57 112/64  Pulse: 88 79  Resp: 16 18  Temp: 37.3 C 36.7 C  SpO2:      Last Pain:  Vitals:   01/24/18 1337  TempSrc:   PainSc: 4    Pain Goal: Patients Stated Pain Goal: 3 (01/24/18 1310)               Lovina Zuver

## 2018-01-24 NOTE — MAU Note (Signed)
SROM 0330.  Clear fluid.  Some scant vaginal bleeding.  No VE since 2 weeks ago.  Reports last fetal movement around 0330.  GBS+

## 2018-01-24 NOTE — Lactation Note (Signed)
This note was copied from a baby's chart. Lactation Consultation Note  Patient Name: Andrea [name] Today's Date: 01/24/2018 Reason for consult: Initial assessment;Term   P2 mom with 2 years BF experience with now 274.31 year old son.   Mom states she feels like she has no milk.  Infant asleep on chest STS after bath.  LC offers to teach and help mom hand express.  Mom returned demonstration but no drops of colostrum seen on nipple.  Mom states she is tender but she feels like infant has a deep latch.  Mom states she nursed infant an hour ago for 30 minutes.  LC encouraged mom to try bf in football or cross cradle position is order to give good head support to help prevent nipple soreness.  LC showed mom hand expression and position illustrations in mom and baby book.    Mom requests hand pump and states she used it with her son and preferred that over the DEBP.  LC provided hand pump.  BF basics reviewed with mom.  Encouraged mom to feed at least 8-12 times in a 24 hour period and to feed on infant cue.  Mom know about BFSG and lactation resource sheet and brochure given to mom.  LC encouraged mom to call out for assistance when infant wakes to feed again to try to see why there is discomfort.  LC encouraged breast support and plenty of pillows to support mom during feed.  Mom knows to massage then try to hand express before and after feeds.        Maternal Data Formula Feeding for Exclusion: No Has patient been taught Hand Expression?: Yes(LC demonstrated and mom returned demonstration but no drops seen at nipple) Does the patient have breastfeeding experience prior to this delivery?: Yes  Feeding Feeding Type: Breast Fed Length of feed: 15 min  LATCH Score                   Interventions Interventions: Breast feeding basics reviewed;Skin to skin;Hand express;Breast massage  Lactation Tools Discussed/Used WIC Program: No   Consult Status Consult Status:  Follow-up Date: 01/25/18 Follow-up type: In-patient    Maryruth HancockKelly Suzanne [facility] CenterBlack 01/24/2018, 10:32 PM

## 2018-01-24 NOTE — H&P (Signed)
Andrea [name] is a 32 y.o. female presenting for active labor and leaking of fluid.  Contractions started about midnight and water broke 5 minutes prior to arrival.   Patient Active Problem List   Diagnosis Date Noted  . Uterine size date discrepancy pregnancy, second trimester 10/30/2017  . Supervision of other normal pregnancy, antepartum 08/06/2017   . OB History    Gravida Para Term Preterm AB Living   2 1 1     1    SAB TAB Ectopic Multiple Live Births           1     Past Medical History:  Diagnosis Date  . [id number](478.2)    Past Surgical History:  Procedure Laterality Date  . NO PAST SURGERIES     Family History: family history is not on file. Social History:  reports that  has never smoked. She uses smokeless tobacco. She reports that she does not drink alcohol or use drugs.     Maternal Diabetes: No Genetic Screening: Declined Maternal Ultrasounds/Referrals: Normal Fetal Ultrasounds or other Referrals:  None Maternal Substance Abuse:  No Significant Maternal Medications:  None Significant Maternal Lab Results:  Lab values include: Group B Strep positive Other Comments:  None  Review of Systems  Constitutional: Negative for chills and fever.  Respiratory: Negative for shortness of breath.   Cardiovascular: Negative for leg swelling.  Gastrointestinal: Positive for abdominal pain. Negative for constipation, diarrhea, nausea and vomiting.  Neurological: Negative for dizziness and focal weakness.   Maternal Medical History:  Reason for admission: Rupture of membranes and contractions.  Nausea.  Contractions: Onset was 3-5 hours ago.   Frequency: irregular.   Perceived severity is moderate.    Fetal activity: Perceived fetal activity is normal.   Last perceived fetal movement was within the past hour.    Prenatal complications: No bleeding, PIH, placental abnormality, polyhydramnios, pre-eclampsia or preterm labor.   Prenatal Complications - Diabetes:  none.    Dilation: 4 Effacement (%): 70 Station: -2 Exam by:: [employer], RN Pulse (!) 102, temperature (!) 97.5 F (36.4 C), temperature source Oral, resp. rate (!) 21, last menstrual period 03/20/2017, SpO2 100 %, currently breastfeeding. Maternal Exam:  Uterine Assessment: Contraction strength is moderate.  Contraction frequency is irregular.   Abdomen: Patient reports no abdominal tenderness. Fundal height is 37.   Fetal presentation: vertex  Introitus: Normal vulva. Normal vagina.  Amniotic fluid character: clear.  Pelvis: adequate for delivery.      Fetal Exam Fetal Monitor Review: Mode: ultrasound.   Baseline rate: 140.  Variability: moderate (6-25 bpm).   Pattern: accelerations present and no decelerations.    Fetal State Assessment: Category I - tracings are normal.     Physical Exam  Constitutional: [name] oriented to person, place, and time. She appears well-developed and well-nourished.  HENT:  Head: Normocephalic.  Cardiovascular: Normal rate and regular rhythm.  Respiratory: Effort normal. No respiratory distress. She has no wheezes. She has no rales.  GI: Soft. She exhibits no distension. There is no tenderness. There is no rebound and no guarding.  Musculoskeletal: Normal range of motion.  Neurological: [name] alert and oriented to person, place, and time.  Skin: Skin is warm and dry.  Psychiatric: She has a normal mood and affect.    Prenatal labs: ABO, Rh: O/Positive/-- (09/10 1538) Antibody: Negative (09/10 1538) Rubella: 2.12 (09/10 1538) RPR: Non Reactive (12/04 0923)  HBsAg: Negative (09/10 1538)  HIV: Non Reactive (12/04 0923)  GBS:  Positive (02/11 1123)   Assessment/Plan: Single IUP at 7193w3d Uterine contractions, latent labor SROM at term [place] Elevated BP, unclear if this is gestational hypertension  [facility]  Wynelle BourgeoisMarie Williams 01/24/2018, 4:17 AM

## 2018-01-25 LAB — BIRTH TISSUE RECOVERY COLLECTION (PLACENTA DONATION)

## 2018-01-25 LAB — PROTEIN / CREATININE RATIO, URINE
Creatinine, Urine: 63 mg/dL
PROTEIN CREATININE RATIO: 0.29 mg/mg{creat} — AB (ref 0.00–0.15)
Total Protein, Urine: 18 mg/dL

## 2018-01-25 MED ORDER — IBUPROFEN 600 MG PO TABS: 600.0000 mg | ORAL_TABLET | Freq: Four times a day (QID) | ORAL | 0 refills | Status: DC

## 2018-01-25 NOTE — Progress Notes (Signed)
POSTPARTUM PROGRESS NOTE  Post Partum Day 1 Subjective:  Andrea [name] is a 32 y.o. [id number] 5421w3d s/p SVD. Patient with one dose ampicillin prior to delivery for GBS+, no postpartum complications noted. Afebrile.  No acute events overnight.  Pt denies problems with ambulating, voiding or po intake.  She denies nausea or vomiting.  Pain is well controlled. Lochia Small. Patient desires discharge today.  Objective: Blood pressure 122/75, pulse 72, temperature 98.7 F (37.1 C), temperature source Oral, resp. rate 16, height 5' (1.524 m), weight 185 lb (83.9 kg), last menstrual period 03/20/2017, SpO2 100 %, unknown if currently breastfeeding.  Physical Exam:  General: alert, cooperative and no distress Lochia:normal flow Chest: no respiratory distress Heart:regular rate, distal pulses intact Abdomen: soft, nontender,  Uterine Fundus: firm, appropriately tender DVT Evaluation: No calf swelling or tenderness Extremities: no edema  Recent Labs    01/24/18 0415  HGB 13.8  HCT 41.0    Assessment/Plan:  ASSESSMENT: Andrea [name] is a 32 y.o. [id number] 8021w3d s/p SVD.  Breastfeeding  Contraception - undecided May discharge today per patient request if baby discharged, otherwise plan for discharge tomorrow.   LOS: 1 day   Ellwood DenseAlison Rumball, DO 01/25/2018, 10:16 AM

## 2018-01-25 NOTE — Discharge Summary (Signed)
OB Discharge Summary     Patient Name: Andrea [name] DOB: [birth year] MRN: [id number] Date of admission: 01/24/2018  Delivering MD: Sharen Counter A )  Date of discharge: 01/26/2018    Admitting diagnosis: SROM and labor Intrauterine pregnancy: [redacted]w[redacted]d    Secondary diagnosis:  Active Problems:   Patient Active Problem List   Diagnosis Date Noted  . Uterine contractions during pregnancy 01/24/2018  . NSVD (normal spontaneous vaginal delivery) 01/24/2018  . Uterine size date discrepancy pregnancy, second trimester 10/30/2017  . Supervision of other normal pregnancy, antepartum 08/06/2017  . 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 Infertility, anovulation 05/21/2017  . Ovarian mass, right 05/21/2017    Additional problems: none     Discharge diagnosis: Term Pregnancy Delivered                                                                                                Post partum procedures:none  Complications: None  Hospital course:  Onset of Labor With Vaginal Delivery     32 y.o. yo [id number] at [redacted]w[redacted]d was admitted in Active Labor on 01/24/2018. Patient had an uncomplicated labor course as follows:  Membrane Rupture Time/Date: 3:30 AM ,01/24/2018   Intrapartum Procedures: Episiotomy: None [1]                                         Lacerations:  2nd degree [3];Perineal [11]  Patient had a delivery of a Viable infant. 01/24/2018  Information for the patient's newborn:  Shyann, Hefner Girl Avrianna [[id number]]  Delivery Method: Vaginal, Spontaneous(Filed from Delivery Summary)    Pateint had an uncomplicated postpartum course.  [name] ambulating, tolerating a regular diet, passing flatus, and urinating well. Patient is discharged home in stable condition on 01/26/18.   Physical exam  Vitals:   01/25/18 1826 01/26/18 0517  BP: 121/74 112/90  Pulse: 76 89  Resp: 20 20  Temp: 98 F (36.7 C) 98.4 F (36.9 C)  SpO2:      General: alert, cooperative and no distress Lochia:  appropriate Uterine Fundus: firm Incision: N/A DVT Evaluation: No evidence of DVT seen on physical exam.  Labs: No results found for this or any previous visit (from the past 24 hour(s)).   Discharge instruction: per After Visit Summary and "Baby and Me Booklet".  After visit meds:  No Known Allergies  Allergies as of 01/26/2018   No Known Allergies     Medication List    STOP taking these medications   penicillin v potassium 500 MG tablet Commonly known as:  VEETID     TAKE these medications   ibuprofen 600 MG tablet Commonly known as:  ADVIL,MOTRIN Take 1 tablet (600 mg total) by mouth every 6 (six) hours.   prenatal multivitamin Tabs tablet Take 1 tablet by mouth at bedtime.   TYLENOL EXTRA STRENGTH PO Take 1,000 mg by mouth every 6 (six) hours as needed (pain/ headache).        Diet: routine diet  Activity: Advance as tolerated. Pelvic rest for  6 weeks.   Outpatient follow up:4 weeks Future Appointments:  Future Appointments  Date Time Provider Department Center  03/04/2018  2:15 PM Armando ReichertHogan, Heather D, CNM [facility]    Follow up Appt: No Follow-up on file.     Postpartum contraception: undecided  Newborn Data: APGAR (1 MIN): 8   APGAR (5 MINS): 9     Baby Feeding: Breast Disposition:home with mother  Rolm Bookbindermber Blakeley Margraf, DO  01/26/2018

## 2018-01-26 NOTE — Lactation Note (Signed)
This note was copied from a baby'[name] chart. Lactation Consultation Note  Patient Name: Andrea [name] Today'[name] Date: 01/26/2018  Mom reports good feedings.  Baby is cluster feeding.  Mom with some nipple tenderness.  No questions or concerns at present.  Lactation outpatient services and support reviewed and encouraged prn.   Maternal Data    Feeding Feeding Type: Breast Fed Length of feed: 45 min  LATCH Score Latch: Grasps breast easily, tongue down, lips flanged, rhythmical sucking.(needed some assisted flanging bottom lip out)  Audible Swallowing: A few with stimulation  Type of Nipple: Everted at rest and after stimulation  Comfort (Breast/Nipple): Filling, red/small blisters or bruises, mild/mod discomfort  Hold (Positioning): Assistance needed to correctly position infant at breast and maintain latch.  LATCH Score: 7  Interventions    Lactation Tools Discussed/Used     Consult Status      Huston FoleyMOULDEN, Andrea [name] 01/26/2018, 9:31 AM

## 2018-01-26 NOTE — Discharge Instructions (Signed)

## 2018-01-26 NOTE — Progress Notes (Addendum)
S: PPD#2 SVD routine labor and pp course.Received called from RN that pt c/o left eye swelling and itching throughout the night. That is now improving upon assessment. Denies headache, itching, pain or any discomforts at this time  O: No redness, drainage, eye discoloration, bruising or tenderness noted.  A: Ieft eye swelling, incidental to pregnancy. Normal labor and pp course.  P: Continue plan of care to d/c home. Notify if symptom worsens.  CNM attestation:  I have seen and examined this patient; I agree with above documentation in the midwife's note.   Andrea [name] is a 32 y.o. G2P2002 on PPD#2 following SVD reporting eye swelling/discomfort last night, improved today.  PE: BP 112/90 (BP Location: Left Arm)   Pulse 89   Temp 98.4 F (36.9 C) (Oral)   Resp 20   Ht 5' (1.524 m)   Wt 185 lb (83.9 kg)   LMP 03/20/2017 (Approximate)   SpO2 100%   Breastfeeding? Unknown   BMI 36.13 kg/m  Gen: calm comfortable, NAD Resp: normal effort, no distress Abd: gravid  ROS, labs, PMH reviewed VS reviewed, nursing note reviewed,  Constitutional: well developed, well nourished, no distress HEENT: normocephalic, right eye wnl, left eye with mild edema, no erythema, exudate CV: normal rate Pulm/chest wall: normal effort Abdomen: soft Neuro: alert and oriented x 3 Skin: warm, dry Psych: affect normal   Plan: Continue plan for D/C home today No evidence of infection on exam today Notify office if eye pain/swelling persists  Andrea [name], CNM 12:28 PM

## 2018-01-26 NOTE — Progress Notes (Signed)
Upon assessing patient, patient complains of left eyelid swelling. Eyelid swollen while RN in room but patient able to open eye. Patient stated that eye started itching initially and she rubbed it. Then patient stated she could not open eye at all around 0800 this morning; however, it has not been itching since initial irritation. No other complaints noted from patient. Assessed patient and baby and went to get an oxycodone for patient due to abdominal cramping. When entering room after getting pain medication, patient's eyelid looked about 95% better and patient stated that it felt better. Called student midwife, Swiyyah Harrington and notified, in which she stated she would come to assess patient prior to discharge. Earl Galasborne, Linda HedgesStefanie JeffersonHudspeth

## 2018-01-29 ENCOUNTER — Encounter: Payer: BLUE CROSS/BLUE SHIELD | Admitting: Obstetrics and Gynecology

## 2018-03-04 ENCOUNTER — Ambulatory Visit: Payer: BLUE CROSS/BLUE SHIELD | Admitting: Advanced Practice Midwife

## 2018-08-16 ENCOUNTER — Other Ambulatory Visit (HOSPITAL_COMMUNITY)
Admission: RE | Admit: 2018-08-16 | Discharge: 2018-08-16 | Disposition: A | Discharge status: Home / Self Care | Payer: BLUE CROSS/BLUE SHIELD | Source: Ambulatory Visit | Attending: Nurse Practitioner | Admitting: Nurse Practitioner

## 2018-08-16 ENCOUNTER — Ambulatory Visit (INDEPENDENT_AMBULATORY_CARE_PROVIDER_SITE_OTHER): Payer: BLUE CROSS/BLUE SHIELD | Admitting: Nurse Practitioner

## 2018-08-16 ENCOUNTER — Encounter: Payer: Self-pay | Admitting: Nurse Practitioner

## 2018-08-16 VITALS — BP 100/80 | HR 91 | Temp 98.1°F | Ht 60.0 in | Wt 177.6 lb

## 2018-08-16 DIAGNOSIS — K5900 Constipation, unspecified: Secondary | ICD-10-CM

## 2018-08-16 DIAGNOSIS — N912 Amenorrhea, unspecified: Secondary | ICD-10-CM | POA: Diagnosis not present

## 2018-08-16 DIAGNOSIS — J302 Other seasonal allergic rhinitis: Secondary | ICD-10-CM | POA: Diagnosis not present

## 2018-08-16 DIAGNOSIS — N760 Acute vaginitis: Secondary | ICD-10-CM | POA: Diagnosis not present

## 2018-08-16 DIAGNOSIS — Z0001 Encounter for general adult medical examination with abnormal findings: Secondary | ICD-10-CM

## 2018-08-16 DIAGNOSIS — Z124 Encounter for screening for malignant neoplasm of cervix: Secondary | ICD-10-CM | POA: Diagnosis not present

## 2018-08-16 DIAGNOSIS — K644 Residual hemorrhoidal skin tags: Secondary | ICD-10-CM | POA: Diagnosis not present

## 2018-08-16 DIAGNOSIS — Z23 Encounter for immunization: Secondary | ICD-10-CM | POA: Diagnosis not present

## 2018-08-16 DIAGNOSIS — N926 Irregular menstruation, unspecified: Secondary | ICD-10-CM | POA: Insufficient documentation

## 2018-08-16 HISTORY — DX: Residual hemorrhoidal skin tags: K64.4

## 2018-08-16 HISTORY — DX: Constipation, unspecified: K59.00

## 2018-08-16 LAB — POCT URINE PREGNANCY: Preg Test, Ur: NEGATIVE

## 2018-08-16 LAB — COMPREHENSIVE METABOLIC PANEL
ALT: 18 U/L (ref 0–35)
AST: 17 U/L (ref 0–37)
Albumin: 4.2 g/dL (ref 3.5–5.2)
Alkaline Phosphatase: 56 U/L (ref 39–117)
BUN: 16 mg/dL (ref 6–23)
CO2: 27 meq/L (ref 19–32)
Calcium: 9.6 mg/dL (ref 8.4–10.5)
Chloride: 105 mEq/L (ref 96–112)
Creatinine, Ser: 0.65 mg/dL (ref 0.40–1.20)
GFR: 112.59 mL/min (ref >60.00)
GLUCOSE: 85 mg/dL (ref 70–99)
POTASSIUM: 4.4 meq/L (ref 3.5–5.1)
SODIUM: 140 meq/L (ref 135–145)
Total Bilirubin: 0.5 mg/dL (ref 0.2–1.2)
Total Protein: 7.2 g/dL (ref 6.0–8.3)

## 2018-08-16 LAB — LIPID PANEL
CHOL/HDL RATIO: 5
Cholesterol: 234 mg/dL — ABNORMAL HIGH (ref 0–200)
HDL: 50.5 mg/dL (ref >39.00)
LDL CALC: 163 mg/dL — AB (ref 0–99)
NonHDL: 183.42
TRIGLYCERIDES: 102 mg/dL (ref 0.0–149.0)
VLDL: 20.4 mg/dL (ref 0.0–40.0)

## 2018-08-16 LAB — CBC
HCT: 43 % (ref 36.0–46.0)
Hemoglobin: 14.6 g/dL (ref 12.0–15.0)
MCHC: 34 g/dL (ref 30.0–36.0)
MCV: 84.5 fl (ref 78.0–100.0)
PLATELETS: 206 10*3/uL (ref 150.0–400.0)
RBC: 5.1 Mil/uL (ref 3.87–5.11)
RDW: 13.9 % (ref 11.5–15.5)
WBC: 4.1 10*3/uL (ref 4.0–10.5)

## 2018-08-16 LAB — TSH: TSH: 0.97 u[IU]/mL (ref 0.35–4.50)

## 2018-08-16 MED ORDER — DOCUSATE SODIUM 100 MG PO CAPS: 100.0000 mg | ORAL_CAPSULE | Freq: Every day | ORAL | 5 refills | Status: DC

## 2018-08-16 MED ORDER — FLUCONAZOLE 150 MG PO TABS: 150.0000 mg | ORAL_TABLET | Freq: Every day | ORAL | 0 refills | Status: DC

## 2018-08-16 MED ORDER — FLUTICASONE PROPIONATE 50 MCG/ACT NA SUSP: 2.0000 | Freq: Every day | NASAL | 3 refills | Status: DC

## 2018-08-16 MED ORDER — HYDROCORTISONE 2.5 % RE CREA: 1.0000 "application " | TOPICAL_CREAM | Freq: Two times a day (BID) | RECTAL | 0 refills | Status: DC

## 2018-08-16 MED ORDER — METRONIDAZOLE 0.75 % VA GEL: 1.0000 | Freq: Every day | VAGINAL | 0 refills | Status: DC

## 2018-08-16 NOTE — Progress Notes (Signed)
Subjective:    Patient ID: Andrea [name], female    DOB: 1986-07-15, 32 y.o.   MRN: [id number]  Patient presents today for complete physical and evaluation of acute symptoms  Vaginal Itching  The patient's primary symptoms include genital itching and vaginal discharge. The patient's pertinent negatives include no genital lesions, genital odor, genital rash, pelvic pain or vaginal bleeding. This is a new problem. The current episode started 1 to 4 weeks ago. The problem occurs constantly. The problem has been unchanged. The patient is experiencing no pain. The problem affects both sides. Associated symptoms include constipation. Pertinent negatives include no abdominal pain, anorexia, back pain, diarrhea, discolored urine, dysuria, fever, flank pain, frequency, hematuria, nausea, painful intercourse or urgency. The vaginal discharge was thick and mucopurulent. There has been no bleeding. The symptoms are aggravated by intercourse. She has tried nothing for the symptoms. [name] sexually active. No, her partner does not have an STD. She uses nothing for contraception. Menstrual history: no menstrual cycle since child birth 6months ago. There is no history of PID or an STD.   Sexual History (orientation,birth control, marital status, STD):married, sexually active, not interested in use of contracetion at this time.  Depression/Suicide: Depression screen [facility] 2/9 08/16/2018 01/21/2018 01/14/2018 12/24/2017  Decreased Interest 0 0 0 0  Down, Depressed, Hopeless 0 0 0 0  PHQ - 2 Score 0 0 0 0  Altered sleeping - 1 0 0  Tired, decreased energy - 3 0 0  Change in appetite - 2 0 0  Feeling bad or failure about yourself  - 0 0 0  Trouble concentrating - 1 0 0  Moving slowly or fidgety/restless - 1 0 0  Suicidal thoughts - 0 0 0  PHQ-9 Score - 8 0 0   No flowsheet data found.  Vision:not needed per patient  Dental:done every67months  Immunizations: (TDAP, Hep C screen, Pneumovax, Influenza, zoster)    Health Maintenance  Topic Date Due  . Pap Smear  08/06/2020  . Tetanus Vaccine  10/31/2027  . Flu Shot  Completed  . HIV Screening  Completed   Diet:regular.  Weight:  Wt Readings from Last 3 Encounters:  08/16/18 177 lb 9.6 oz (80.6 kg)  01/24/18 185 lb (83.9 kg)  01/21/18 185 lb 6.4 oz (84.1 kg)   Exercise:walking  Fall Risk: Fall Risk  08/16/2018  Falls in the past year? No   Home Safety:home with husband and 2children (55yrs old and 6months)  Advanced Directive: Advanced Directives 01/24/2018  Does Patient Have a Medical Advance Directive? No  Would patient like information on creating a medical advance directive? No - Patient declined  Pre-existing out of facility DNR order (yellow form or pink MOST form) -     Medications and allergies reviewed with patient and updated if appropriate.  Patient Active Problem List   Diagnosis Date Noted  . Seasonal allergic rhinitis 08/16/2018  . Constipation 08/16/2018  . External hemorrhoids without complication 08/16/2018  . Amenorrhea 08/16/2018  . Uterine contractions during pregnancy 01/24/2018  . Uterine size date discrepancy pregnancy, second trimester 10/30/2017  . Supervision of other normal pregnancy, antepartum 08/06/2017  . 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 Infertility, anovulation 05/21/2017  . Ovarian mass, right 05/21/2017    Current Outpatient Medications on File Prior to Visit  Medication Sig Dispense Refill  . Prenatal Vit-Fe Fumarate-FA (PRENATAL MULTIVITAMIN) TABS Take 1 tablet by mouth at bedtime.    . Acetaminophen (TYLENOL EXTRA STRENGTH PO) Take 1,000 mg by mouth  every 6 (six) hours as needed (pain/ headache).     [name] ibuprofen (ADVIL,MOTRIN) 600 MG tablet Take 1 tablet (600 mg total) by mouth every 6 (six) hours. (Patient not taking: Reported on 08/16/2018) 30 tablet 0   No current facility-administered medications on file prior to visit.     Past Medical History:  Diagnosis Date  . [id number](478.2)      Past Surgical History:  Procedure Laterality Date  . NO PAST SURGERIES      Social History   Socioeconomic History  . Marital status: Married    Spouse name: Not on file  . Number of children: 2  . Years of education: Not on file  . Highest education level: Not on file  Occupational History    Comment: housewife  Social Needs  . Financial resource strain: Not on file  . Food insecurity:    Worry: Not on file    Inability: Not on file  . Transportation needs:    Medical: Not on file    Non-medical: Not on file  Tobacco Use  . Smoking status: Never Smoker  . Smokeless tobacco: Current User  Substance and Sexual Activity  . Alcohol use: No  . Drug use: No  . Sexual activity: Yes    Birth control/protection: None  Lifestyle  . Physical activity:    Days per week: Not on file    Minutes per session: Not on file  . Stress: Not on file  Relationships  . Social connections:    Talks on phone: Not on file    Gets together: Not on file    Attends religious service: Not on file    Active member of club or organization: Not on file    Attends meetings of clubs or organizations: Not on file    Relationship status: Not on file  Other Topics Concern  . Not on file  Social History Narrative  . Not on file    History reviewed. No pertinent family history.      Review of Systems  Constitutional: Negative for fever.  Gastrointestinal: Positive for constipation. Negative for abdominal pain, anorexia, diarrhea and nausea.  Genitourinary: Positive for vaginal discharge. Negative for dysuria, flank pain, frequency, hematuria, pelvic pain and urgency.  Musculoskeletal: Negative for back pain.    Objective:   Vitals:   08/16/18 1114  BP: 100/80  Pulse: 91  Temp: 98.1 F (36.7 C)  SpO2: 97%    Body mass index is 34.69 kg/m.   Physical Examination:  Physical Exam  Constitutional: [name] oriented to person, place, and time. She appears well-developed and  well-nourished.  HENT:  Right Ear: External ear and ear canal normal. Tympanic membrane is not injected and not erythematous. A middle ear effusion is present.  Left Ear: External ear and ear canal normal. Tympanic membrane is not injected and not erythematous. A middle ear effusion is present.  Nose: Nose normal.  Mouth/Throat: Uvula is midline and oropharynx is clear and moist. No posterior oropharyngeal erythema.  Eyes: Pupils are equal, round, and reactive to light. Conjunctivae and EOM are normal.  Neck: Normal range of motion. Neck supple. No thyromegaly present.  Cardiovascular: Normal rate, regular rhythm, normal heart sounds and intact distal pulses.  Pulmonary/Chest: Effort normal and breath sounds normal.  Declined breast exam due to lactation  Abdominal: Soft. Bowel sounds are normal.  Genitourinary: Rectal exam shows external hemorrhoid. Rectal exam shows no fissure and no tenderness. Pelvic exam was performed with patient supine.  There is no rash or tenderness on the right labia. There is no rash or tenderness on the left labia. Uterus is not tender. Cervix exhibits discharge and friability. Cervix exhibits no motion tenderness. Right adnexum displays no tenderness and no fullness. Left adnexum displays no tenderness and no fullness. There is erythema in the vagina. Vaginal discharge found.  Genitourinary Comments: Chaperone present  Musculoskeletal: Normal range of motion. She exhibits no edema.  Lymphadenopathy:    She has no cervical adenopathy. No inguinal adenopathy noted on the right or left side.  Neurological: [name] alert and oriented to person, place, and time.  Skin: No rash noted.  Psychiatric: She has a normal mood and affect. Her behavior is normal. Thought content normal.  Vitals reviewed.   ASSESSMENT and PLAN:  Andrea [name] was seen today for establish care.  Diagnoses and all orders for this visit:  Acute vaginitis -     Cytology - PAP -     fluconazole  (DIFLUCAN) 150 MG tablet; Take 1 tablet (150 mg total) by mouth daily. Take second tab 3days apart from first tab -     metroNIDAZOLE (METROGEL VAGINAL) 0.75 % vaginal gel; Place 1 Applicatorful vaginally at bedtime. [id number]  Encounter for preventative adult health care exam with abnormal findings -     CBC -     Comprehensive metabolic panel -     TSH -     Lipid panel  Amenorrhea -     POCT urine pregnancy  External hemorrhoids without complication -     hydrocortisone (ANUSOL-HC) 2.5 % rectal cream; Place 1 application rectally 2 (two) times daily.  Seasonal allergic rhinitis, unspecified trigger -     fluticasone (FLONASE) 50 MCG/ACT nasal spray; Place 2 sprays into both nostrils daily.  Need for influenza vaccination -     Flu Vaccine QUAD 36+ mos IM  Encounter for Papanicolaou smear for cervical cancer screening -     Cytology - PAP  Constipation, unspecified constipation type -     docusate sodium (COLACE) 100 MG capsule; Take 1 capsule (100 mg total) by mouth daily.   No problem-specific Assessment & Plan notes found for this encounter.     Follow up: Return in about 1 year (around 08/17/2019) for CPE (fasting).  Andrea Pennaharlotte Aileen Amore, NP

## 2018-08-16 NOTE — Patient Instructions (Addendum)
Urine pregnancy was negative.  Normal CBC, CMP and TSH. Pending PAP. Lipid panel indicates elevated total cholesterol and LDL. Maintain low carb and low fat diet. Need to repeat lipid panel in 1year  Colace for constipation Metronidazole and diflucan for vaginal discharge.  anusol for external hemorrhoid.  flonase for seasonal allergy.  Health Maintenance, Female Adopting a healthy lifestyle and getting preventive care can go a long way to promote health and wellness. Talk with your health care provider about what schedule of regular examinations is right for you. This is a good chance for you to check in with your provider about disease prevention and staying healthy. In between checkups, there are plenty of things you can do on your own. Experts have done a lot of research about which lifestyle changes and preventive measures are most likely to keep you healthy. Ask your health care provider for more information. Weight and diet Eat a healthy diet  Be sure to include plenty of vegetables, fruits, low-fat dairy products, and lean protein.  Do not eat a lot of foods high in solid fats, added sugars, or salt.  Get regular exercise. This is one of the most important things you can do for your health. ? Most adults should exercise for at least 150 minutes each week. The exercise should increase your heart rate and make you sweat (moderate-intensity exercise). ? Most adults should also do strengthening exercises at least twice a week. This is in addition to the moderate-intensity exercise.  Maintain a healthy weight  Body mass index (BMI) is a measurement that can be used to identify possible weight problems. It estimates body fat based on height and weight. Your health care provider can help determine your BMI and help you achieve or maintain a healthy weight.  For females 42 years of age and older: ? A BMI below 18.5 is considered underweight. ? A BMI of 18.5 to 24.9 is normal. ? A  BMI of 25 to 29.9 is considered overweight. ? A BMI of 30 and above is considered obese.  Watch levels of cholesterol and blood lipids  You should start having your blood tested for lipids and cholesterol at 31 years of age, then have this test every 5 years.  You may need to have your cholesterol levels checked more often if: ? Your lipid or cholesterol levels are high. ? You are older than 31 years of age. ? You are at high risk for heart disease.  Cancer screening Lung Cancer  Lung cancer screening is recommended for adults 26-55 years old who are at high risk for lung cancer because of a history of smoking.  A yearly low-dose CT scan of the lungs is recommended for people who: ? Currently smoke. ? Have quit within the past 15 years. ? Have at least a 30-pack-year history of smoking. A pack year is smoking an average of one pack of cigarettes a day for 1 year.  Yearly screening should continue until it has been 15 years since you quit.  Yearly screening should stop if you develop a health problem that would prevent you from having lung cancer treatment.  Breast Cancer  Practice breast self-awareness. This means understanding how your breasts normally appear and feel.  It also means doing regular breast self-exams. Let your health care provider know about any changes, no matter how small.  If you are in your 20s or 30s, you should have a clinical breast exam (CBE) by a health care provider every 1-3  years as part of a regular health exam.  If you are 6 or older, have a CBE every year. Also consider having a breast X-ray (mammogram) every year.  If you have a family history of breast cancer, talk to your health care provider about genetic screening.  If you are at high risk for breast cancer, talk to your health care provider about having an MRI and a mammogram every year.  Breast cancer gene (BRCA) assessment is recommended for women who have family members with  BRCA-related cancers. BRCA-related cancers include: ? Breast. ? Ovarian. ? Tubal. ? Peritoneal cancers.  Results of the assessment will determine the need for genetic counseling and BRCA1 and BRCA2 testing.  Cervical Cancer Your health care provider may recommend that you be screened regularly for cancer of the pelvic organs (ovaries, uterus, and vagina). This screening involves a pelvic examination, including checking for microscopic changes to the surface of your cervix (Pap test). You may be encouraged to have this screening done every 3 years, beginning at age 75.  For women ages 100-65, health care providers may recommend pelvic exams and Pap testing every 3 years, or they may recommend the Pap and pelvic exam, combined with testing for human papilloma virus (HPV), every 5 years. Some types of HPV increase your risk of cervical cancer. Testing for HPV may also be done on women of any age with unclear Pap test results.  Other health care providers may not recommend any screening for nonpregnant women who are considered low risk for pelvic cancer and who do not have symptoms. Ask your health care provider if a screening pelvic exam is right for you.  If you have had past treatment for cervical cancer or a condition that could lead to cancer, you need Pap tests and screening for cancer for at least 20 years after your treatment. If Pap tests have been discontinued, your risk factors (such as having a new sexual partner) need to be reassessed to determine if screening should resume. Some women have medical problems that increase the chance of getting cervical cancer. In these cases, your health care provider may recommend more frequent screening and Pap tests.  Colorectal Cancer  This type of cancer can be detected and often prevented.  Routine colorectal cancer screening usually begins at 31 years of age and continues through 31 years of age.  Your health care provider may recommend screening  at an earlier age if you have risk factors for colon cancer.  Your health care provider may also recommend using home test kits to check for hidden blood in the stool.  A small camera at the end of a tube can be used to examine your colon directly (sigmoidoscopy or colonoscopy). This is done to check for the earliest forms of colorectal cancer.  Routine screening usually begins at age 35.  Direct examination of the colon should be repeated every 5-10 years through 31 years of age. However, you may need to be screened more often if early forms of precancerous polyps or small growths are found.  Skin Cancer  Check your skin from head to toe regularly.  Tell your health care provider about any new moles or changes in moles, especially if there is a change in a mole's shape or color.  Also tell your health care provider if you have a mole that is larger than the size of a pencil eraser.  Always use sunscreen. Apply sunscreen liberally and repeatedly throughout the day.  Protect yourself  by wearing long sleeves, pants, a wide-brimmed hat, and sunglasses whenever you are outside.  Heart disease, diabetes, and high blood pressure  High blood pressure causes heart disease and increases the risk of stroke. High blood pressure is more likely to develop in: ? People who have blood pressure in the high end of the normal range (130-139/85-89 mm Hg). ? People who are overweight or obese. ? People who are African American.  If you are 77-37 years of age, have your blood pressure checked every 3-5 years. If you are 79 years of age or older, have your blood pressure checked every year. You should have your blood pressure measured twice-once when you are at a hospital or clinic, and once when you are not at a hospital or clinic. Record the average of the two measurements. To check your blood pressure when you are not at a hospital or clinic, you can use: ? An automated blood pressure machine at a  pharmacy. ? A home blood pressure monitor.  If you are between 91 years and 34 years old, ask your health care provider if you should take aspirin to prevent strokes.  Have regular diabetes screenings. This involves taking a blood sample to check your fasting blood sugar level. ? If you are at a normal weight and have a low risk for diabetes, have this test once every three years after 31 years of age. ? If you are overweight and have a high risk for diabetes, consider being tested at a younger age or more often. Preventing infection Hepatitis B  If you have a higher risk for hepatitis B, you should be screened for this virus. You are considered at high risk for hepatitis B if: ? You were born in a country where hepatitis B is common. Ask your health care provider which countries are considered high risk. ? Your parents were born in a high-risk country, and you have not been immunized against hepatitis B (hepatitis B vaccine). ? You have HIV or AIDS. ? You use needles to inject street drugs. ? You live with someone who has hepatitis B. ? You have had sex with someone who has hepatitis B. ? You get hemodialysis treatment. ? You take certain medicines for conditions, including cancer, organ transplantation, and autoimmune conditions.  Hepatitis C  Blood testing is recommended for: ? Everyone born from [birth year] through 1964. ? Anyone with known risk factors for hepatitis C.  Sexually transmitted infections (STIs)  You should be screened for sexually transmitted infections (STIs) including gonorrhea and chlamydia if: ? You are sexually active and are younger than 31 years of age. ? You are older than 31 years of age and your health care provider tells you that you are at risk for this type of infection. ? Your sexual activity has changed since you were last screened and you are at an increased risk for chlamydia or gonorrhea. Ask your health care provider if you are at risk.  If you do not  have HIV, but are at risk, it may be recommended that you take a prescription medicine daily to prevent HIV infection. This is called pre-exposure prophylaxis (PrEP). You are considered at risk if: ? You are sexually active and do not regularly use condoms or know the HIV status of your partner(s). ? You take drugs by injection. ? You are sexually active with a partner who has HIV.  Talk with your health care provider about whether you are at high risk of being infected  with HIV. If you choose to begin PrEP, you should first be tested for HIV. You should then be tested every 3 months for as long as you are taking PrEP. Pregnancy  If you are premenopausal and you may become pregnant, ask your health care provider about preconception counseling.  If you may become pregnant, take 400 to 800 micrograms (mcg) of folic acid every day.  If you want to prevent pregnancy, talk to your health care provider about birth control (contraception). Osteoporosis and menopause  Osteoporosis is a disease in which the bones lose minerals and strength with aging. This can result in serious bone fractures. Your risk for osteoporosis can be identified using a bone density scan.  If you are 27 years of age or older, or if you are at risk for osteoporosis and fractures, ask your health care provider if you should be screened.  Ask your health care provider whether you should take a calcium or vitamin D supplement to lower your risk for osteoporosis.  Menopause may have certain physical symptoms and risks.  Hormone replacement therapy may reduce some of these symptoms and risks. Talk to your health care provider about whether hormone replacement therapy is right for you. Follow these instructions at home:  Schedule regular health, dental, and eye exams.  Stay current with your immunizations.  Do not use any tobacco products including cigarettes, chewing tobacco, or electronic cigarettes.  If you are pregnant,  do not drink alcohol.  If you are breastfeeding, limit how much and how often you drink alcohol.  Limit alcohol intake to no more than 1 drink per day for nonpregnant women. One drink equals 12 ounces of beer, 5 ounces of wine, or 1 ounces of hard liquor.  Do not use street drugs.  Do not share needles.  Ask your health care provider for help if you need support or information about quitting drugs.  Tell your health care provider if you often feel depressed.  Tell your health care provider if you have ever been abused or do not feel safe at home. This information is not intended to replace advice given to you by your health care provider. Make sure you discuss any questions you have with your health care provider. Document Released: 05/29/2011 Document Revised: 04/20/2016 Document Reviewed: 08/17/2015 Elsevier Interactive Patient Education  2018 [employer].   Hemorrhoids Hemorrhoids are swollen veins in and around the rectum or anus. Hemorrhoids can cause pain, itching, or bleeding. Most of the time, they do not cause serious problems. They usually get better with diet changes, lifestyle changes, and other home treatments. Follow these instructions at home: Eating and drinking  Eat foods that have fiber, such as whole grains, beans, nuts, fruits, and vegetables. Ask your doctor about taking products that have added fiber (fibersupplements).  Drink enough fluid to keep your pee (urine) clear or pale yellow. For Pain and Swelling  Take a warm-water bath (sitz bath) for 20 minutes to ease pain. Do this 3-4 times a day.  If directed, put ice on the painful area. It may be helpful to use ice between your warm baths. ? Put ice in a plastic bag. ? Place a towel between your skin and the bag. ? Leave the ice on for 20 minutes, 2-3 times a day. General instructions  Take over-the-counter and prescription medicines only as told by your doctor. ? Medicated creams and medicines that  are inserted into the anus (suppositories) may be used or applied as told.  Exercise often.  Go to the bathroom when you have the urge to poop (to have a bowel movement). Do not wait.  Avoid pushing too hard (straining) when you poop.  Keep the butt area dry and clean. Use wet toilet paper or moist paper towels.  Do not sit on the toilet for a long time. Contact a doctor if:  You have any of these: ? Pain and swelling that do not get better with treatment or medicine. ? Bleeding that will not stop. ? Trouble pooping or you cannot poop. ? Pain or swelling outside the area of the hemorrhoids. This information is not intended to replace advice given to you by your health care provider. Make sure you discuss any questions you have with your health care provider. Document Released: 08/22/2008 Document Revised: 04/20/2016 Document Reviewed: 07/28/2015 Elsevier Interactive Patient Education  [employer].

## 2018-08-19 ENCOUNTER — Encounter: Payer: Self-pay | Admitting: Nurse Practitioner

## 2018-08-20 LAB — CYTOLOGY - PAP
BACTERIAL VAGINITIS: POSITIVE — AB
Candida vaginitis: POSITIVE — AB
Chlamydia: NEGATIVE
DIAGNOSIS: NEGATIVE
HPV: NOT DETECTED
Neisseria Gonorrhea: NEGATIVE
Trichomonas: NEGATIVE

## 2018-09-04 ENCOUNTER — Encounter: Payer: Self-pay | Admitting: Nurse Practitioner

## 2018-09-04 ENCOUNTER — Ambulatory Visit: Payer: BLUE CROSS/BLUE SHIELD | Admitting: Nurse Practitioner

## 2018-09-04 VITALS — BP 106/80 | HR 107 | Temp 98.7°F | Ht 60.0 in | Wt 176.0 lb

## 2018-09-04 DIAGNOSIS — J020 Streptococcal pharyngitis: Secondary | ICD-10-CM

## 2018-09-04 DIAGNOSIS — J039 Acute tonsillitis, unspecified: Secondary | ICD-10-CM | POA: Diagnosis not present

## 2018-09-04 LAB — POCT RAPID STREP A (OFFICE): Rapid Strep A Screen: POSITIVE — AB

## 2018-09-04 MED ORDER — CEFTRIAXONE SODIUM 500 MG IJ SOLR
500.0000 mg | Freq: Once | INTRAMUSCULAR | Status: AC
  Administered 2018-09-04: 500 mg via INTRAMUSCULAR

## 2018-09-04 MED ORDER — METHYLPREDNISOLONE ACETATE 80 MG/ML IJ SUSP
80.0000 mg | Freq: Once | INTRAMUSCULAR | Status: AC
  Administered 2018-09-04: 80 mg via INTRAMUSCULAR

## 2018-09-04 MED ORDER — AMOXICILLIN 875 MG PO TABS: 875.0000 mg | ORAL_TABLET | Freq: Two times a day (BID) | ORAL | 0 refills | Status: AC

## 2018-09-04 MED ORDER — IBUPROFEN 600 MG PO TABS: 600.0000 mg | ORAL_TABLET | Freq: Three times a day (TID) | ORAL | 0 refills | Status: DC

## 2018-09-04 NOTE — Progress Notes (Signed)
Subjective:  Patient ID: Andrea [name], female    DOB: [birth year]  Age: 32 y.o. MRN: [id number]  CC: Sore Throat (patientis complaining of right ear pain going toward jar line, bodyache,fever,chill,sore throat. going on 1 day/took tylenol. breast feeding. )   Sore Throat   This is a new problem. The current episode started yesterday. The problem has been rapidly worsening. There has been no fever. Associated symptoms include ear pain, a hoarse voice, a plugged ear sensation, neck pain, swollen glands and trouble swallowing. Pertinent negatives include no abdominal pain, congestion, coughing, diarrhea, drooling, ear discharge, headaches, shortness of breath, stridor or vomiting. She has had no exposure to strep or mono. She has tried nothing for the symptoms.   Reviewed past Medical, Social and Family history today.  Outpatient Medications Prior to Visit  Medication Sig Dispense Refill  . Acetaminophen (TYLENOL EXTRA STRENGTH PO) Take 1,000 mg by mouth every 6 (six) hours as needed (pain/ headache).     . Prenatal Vit-Fe Fumarate-FA (PRENATAL MULTIVITAMIN) TABS Take 1 tablet by mouth at bedtime.    . docusate sodium (COLACE) 100 MG capsule Take 1 capsule (100 mg total) by mouth daily. (Patient not taking: Reported on 09/04/2018) 30 capsule 5  . fluconazole (DIFLUCAN) 150 MG tablet Take 1 tablet (150 mg total) by mouth daily. Take second tab 3days apart from first tab (Patient not taking: Reported on 09/04/2018) 2 tablet 0  . fluticasone (FLONASE) 50 MCG/ACT nasal spray Place 2 sprays into both nostrils daily. (Patient not taking: Reported on 09/04/2018) 16 g 3  . hydrocortisone (ANUSOL-HC) 2.5 % rectal cream Place 1 application rectally 2 (two) times daily. (Patient not taking: Reported on 09/04/2018) 30 g 0  . ibuprofen (ADVIL,MOTRIN) 600 MG tablet Take 1 tablet (600 mg total) by mouth every 6 (six) hours. (Patient not taking: Reported on 08/16/2018) 30 tablet 0  . metroNIDAZOLE (METROGEL VAGINAL)  0.75 % vaginal gel Place 1 Applicatorful vaginally at bedtime. [phone] (Patient not taking: Reported on 09/04/2018) 70 g 0   No facility-administered medications prior to visit.     ROS See HPI  Objective:  BP 106/80   Pulse (!) 107   Temp 98.7 F (37.1 C) (Oral)   Ht 5' (1.524 m)   Wt 176 lb (79.8 kg)   LMP  (LMP Unknown)   SpO2 99%   BMI 34.37 kg/m   BP Readings from Last 3 Encounters:  09/04/18 106/80  08/16/18 100/80  01/26/18 112/90    Wt Readings from Last 3 Encounters:  09/04/18 176 lb (79.8 kg)  08/16/18 177 lb 9.6 oz (80.6 kg)  01/24/18 185 lb (83.9 kg)    Physical Exam  Constitutional: She appears ill.  HENT:  Mouth/Throat: Oropharyngeal exudate and posterior oropharyngeal erythema present. Tonsils are 2+ on the right. Tonsils are 2+ on the left. Tonsillar exudate.  Neck: Normal range of motion. Neck supple.  Cardiovascular: Normal rate.  Pulmonary/Chest: Effort normal. No stridor.  Lymphadenopathy:    She has cervical adenopathy.  Skin: No rash noted.  Vitals reviewed.   Lab Results  Component Value Date   WBC 4.1 08/16/2018   HGB 14.6 08/16/2018   HCT 43.0 08/16/2018   PLT 206.0 08/16/2018   GLUCOSE 85 08/16/2018   CHOL 234 (H) 08/16/2018   TRIG 102.0 08/16/2018   HDL 50.50 08/16/2018   LDLCALC 163 (H) 08/16/2018   ALT 18 08/16/2018   AST 17 08/16/2018   NA 140 08/16/2018   K 4.4 08/16/2018  CL 105 08/16/2018   CREATININE 0.65 08/16/2018   BUN 16 08/16/2018   CO2 27 08/16/2018   TSH 0.97 08/16/2018    No results found.  Assessment & Plan:   Addilynn was seen today for sore throat.  Diagnoses and all orders for this visit:  Acute streptococcal pharyngitis -     POCT rapid strep A -     methylPREDNISolone acetate (DEPO-MEDROL) injection 80 mg -     cefTRIAXone (ROCEPHIN) injection 500 mg -     ibuprofen (ADVIL,MOTRIN) 600 MG tablet; Take 1 tablet (600 mg total) by mouth 3 (three) times daily. With food -     amoxicillin (AMOXIL) 875  MG tablet; Take 1 tablet (875 mg total) by mouth 2 (two) times daily for 10 days.  Tonsillitis with exudate -     POCT rapid strep A -     methylPREDNISolone acetate (DEPO-MEDROL) injection 80 mg -     cefTRIAXone (ROCEPHIN) injection 500 mg -     ibuprofen (ADVIL,MOTRIN) 600 MG tablet; Take 1 tablet (600 mg total) by mouth 3 (three) times daily. With food -     amoxicillin (AMOXIL) 875 MG tablet; Take 1 tablet (875 mg total) by mouth 2 (two) times daily for 10 days.   I have discontinued Pennie A. Schuknecht's ibuprofen, hydrocortisone, fluticasone, fluconazole, metroNIDAZOLE, and docusate sodium. I am also having her start on ibuprofen and amoxicillin. Additionally, I am having her maintain her prenatal multivitamin and Acetaminophen (TYLENOL EXTRA STRENGTH PO). We will continue to administer methylPREDNISolone acetate and cefTRIAXone.  Meds ordered this encounter  Medications  . methylPREDNISolone acetate (DEPO-MEDROL) injection 80 mg  . cefTRIAXone (ROCEPHIN) injection 500 mg  . ibuprofen (ADVIL,MOTRIN) 600 MG tablet    Sig: Take 1 tablet (600 mg total) by mouth 3 (three) times daily. With food    Dispense:  30 tablet    Refill:  0    Order Specific Question:   Supervising Provider    Answer:   Dianne Dun [[id number]]  . amoxicillin (AMOXIL) 875 MG tablet    Sig: Take 1 tablet (875 mg total) by mouth 2 (two) times daily for 10 days.    Dispense:  20 tablet    Refill:  0    Order Specific Question:   Supervising Provider    Answer:   Dianne Dun [[id number]]    Follow-up: Return in about 2 days (around 09/06/2018) for strep throat re eval.  Alysia Penna, NP

## 2018-09-04 NOTE — Patient Instructions (Addendum)
Maintain soft diet and adequate oral hydration. Return to office on Friday for re eval of throat.  Hold breastfeeding for next 3days. Pump and dump.   Strep Throat Strep throat is an infection of the throat. It is caused by germs. Strep throat spreads from person to person because of coughing, sneezing, or close contact. Follow these instructions at home: Medicines  Take over-the-counter and prescription medicines only as told by your doctor.  Take your antibiotic medicine as told by your doctor. Do not stop taking the medicine even if you feel better.  Have family members who also have a sore throat or fever go to a doctor. Eating and drinking  Do not share food, drinking cups, or personal items.  Try eating soft foods until your sore throat feels better.  Drink enough fluid to keep your pee (urine) clear or pale yellow. General instructions  Rinse your mouth (gargle) with a salt-water mixture 3-4 times per day or as needed. To make a salt-water mixture, stir -1 tsp of salt into 1 cup of warm water.  Make sure that all people in your house wash their hands well.  Rest.  Stay home from school or work until you have been taking antibiotics for 24 hours.  Keep all follow-up visits as told by your doctor. This is important. Contact a doctor if:  Your neck keeps getting bigger.  You get a rash, cough, or earache.  You cough up thick liquid that is green, yellow-brown, or bloody.  You have pain that does not get better with medicine.  Your problems get worse instead of getting better.  You have a fever. Get help right away if:  You throw up (vomit).  You get a very bad headache.  You neck hurts or it feels stiff.  You have chest pain or you are short of breath.  You have drooling, very bad throat pain, or changes in your voice.  Your neck is swollen or the skin gets red and tender.  Your mouth is dry or you are peeing less than normal.  You keep feeling more  tired or it is hard to wake up.  Your joints are red or they hurt. This information is not intended to replace advice given to you by your health care provider. Make sure you discuss any questions you have with your health care provider. Document Released: 05/01/2008 Document Revised: 07/12/2016 Document Reviewed: 03/08/2015 Elsevier Interactive Patient Education  [employer].

## 2018-09-06 ENCOUNTER — Encounter: Payer: Self-pay | Admitting: Nurse Practitioner

## 2018-09-06 ENCOUNTER — Ambulatory Visit: Payer: BLUE CROSS/BLUE SHIELD | Admitting: Nurse Practitioner

## 2018-09-06 VITALS — BP 108/78 | HR 80 | Temp 98.2°F | Ht 60.0 in | Wt 178.0 lb

## 2018-09-06 DIAGNOSIS — J039 Acute tonsillitis, unspecified: Secondary | ICD-10-CM

## 2018-09-06 DIAGNOSIS — J020 Streptococcal pharyngitis: Secondary | ICD-10-CM | POA: Diagnosis not present

## 2018-09-06 MED ORDER — SACCHAROMYCES BOULARDII 250 MG PO CAPS: 250.0000 mg | ORAL_CAPSULE | Freq: Two times a day (BID) | ORAL | Status: DC

## 2018-09-06 NOTE — Progress Notes (Signed)
Subjective:  Patient ID: Andrea [name], female    DOB: 07-15-1986  Age: 32 y.o. MRN: [id number]  CC: Follow-up (follow up on strep throat, still has some sore on her throat. ) and Weight Loss (weight loss consult. )  HPI  [name] presents for re eval of acute pharyngitis and tonsillitis due to strep A. She reports improves sore throat, and no fever.  Reviewed past Medical, Social and Family history today.  Outpatient Medications Prior to Visit  Medication Sig Dispense Refill  . Acetaminophen (TYLENOL EXTRA STRENGTH PO) Take 1,000 mg by mouth every 6 (six) hours as needed (pain/ headache).     [name] amoxicillin (AMOXIL) 875 MG tablet Take 1 tablet (875 mg total) by mouth 2 (two) times daily for 10 days. 20 tablet 0  . ibuprofen (ADVIL,MOTRIN) 600 MG tablet Take 1 tablet (600 mg total) by mouth 3 (three) times daily. With food 30 tablet 0  . Prenatal Vit-Fe Fumarate-FA (PRENATAL MULTIVITAMIN) TABS Take 1 tablet by mouth at bedtime.     No facility-administered medications prior to visit.     ROS Review of Systems  Constitutional: Negative for fever and malaise/fatigue.  HENT: Positive for sore throat. Negative for ear pain.   Respiratory: Negative for cough, shortness of breath and stridor.   Cardiovascular: Negative for chest pain.  Gastrointestinal: Negative for abdominal pain and nausea.  Musculoskeletal: Negative for joint pain and myalgias.  Skin: Negative for rash.   Objective:  BP 108/78   Pulse 80   Temp 98.2 F (36.8 C) (Oral)   Ht 5' (1.524 m)   Wt 178 lb (80.7 kg)   LMP  (LMP Unknown)   SpO2 98%   BMI 34.76 kg/m   BP Readings from Last 3 Encounters:  09/06/18 108/78  09/04/18 106/80  08/16/18 100/80    Wt Readings from Last 3 Encounters:  09/06/18 178 lb (80.7 kg)  09/04/18 176 lb (79.8 kg)  08/16/18 177 lb 9.6 oz (80.6 kg)    Physical Exam  Constitutional: [name] oriented to person, place, and time.  HENT:  Right Ear: External ear and ear canal  normal. No mastoid tenderness. Tympanic membrane is not injected and not erythematous. A middle ear effusion is present.  Left Ear: Tympanic membrane, external ear and ear canal normal. No mastoid tenderness. Tympanic membrane is not injected and not erythematous.  No middle ear effusion.  Mouth/Throat: Uvula is midline. No trismus in the jaw. Oropharyngeal exudate and posterior oropharyngeal erythema present. No posterior oropharyngeal edema. Tonsils are 2+ on the right. Tonsils are 2+ on the left.  Neck: Normal range of motion. Neck supple.  Cardiovascular: Normal rate.  Pulmonary/Chest: Effort normal. No respiratory distress.  Lymphadenopathy:    She has cervical adenopathy.  Neurological: [name] alert and oriented to person, place, and time.  Skin: No rash noted.  Vitals reviewed.   Lab Results  Component Value Date   WBC 4.1 08/16/2018   HGB 14.6 08/16/2018   HCT 43.0 08/16/2018   PLT 206.0 08/16/2018   GLUCOSE 85 08/16/2018   CHOL 234 (H) 08/16/2018   TRIG 102.0 08/16/2018   HDL 50.50 08/16/2018   LDLCALC 163 (H) 08/16/2018   ALT 18 08/16/2018   AST 17 08/16/2018   NA 140 08/16/2018   K 4.4 08/16/2018   CL 105 08/16/2018   CREATININE 0.65 08/16/2018   BUN 16 08/16/2018   CO2 27 08/16/2018   TSH 0.97 08/16/2018    No results found.  Assessment & Plan:   Merriam was seen today for follow-up and weight loss.  Diagnoses and all orders for this visit:  Acute streptococcal pharyngitis -     saccharomyces boulardii (FLORASTOR) 250 MG capsule; Take 1 capsule (250 mg total) by mouth 2 (two) times daily.  Tonsillitis with exudate   I am having Quetzal A. Logsdon start on saccharomyces boulardii. I am also having her maintain her prenatal multivitamin, Acetaminophen (TYLENOL EXTRA STRENGTH PO), ibuprofen, and amoxicillin.  Meds ordered this encounter  Medications  . saccharomyces boulardii (FLORASTOR) 250 MG capsule    Sig: Take 1 capsule (250 mg total) by mouth 2 (two)  times daily.    Order Specific Question:   Supervising Provider    Answer:   Everrett Coombe [[id number]]    Follow-up: No follow-ups on file.  Alysia Penna, NP

## 2018-09-06 NOTE — Patient Instructions (Addendum)
Complete oral abx as prescribed. Ok to resume breast feeding. May also [place] florastor probiotic to minimize GI side effects with oral abx  Need to maintain heart healthy diet and regular exercise to promote weight loss. I do not recommend any appetite suppressant while breast feeding.   Calorie Counting for Weight Loss Calories are units of energy. Your body needs a certain amount of calories from food to keep you going throughout the day. When you eat more calories than your body needs, your body stores the extra calories as fat. When you eat fewer calories than your body needs, your body burns fat to get the energy it needs. Calorie counting means keeping track of how many calories you eat and drink each day. Calorie counting can be helpful if you need to lose weight. If you make sure to eat fewer calories than your body needs, you should lose weight. Ask your health care provider what a healthy weight is for you. For calorie counting to work, you will need to eat the right number of calories in a day in order to lose a healthy amount of weight per week. A dietitian can help you determine how many calories you need in a day and will give you suggestions on how to reach your calorie goal.  A healthy amount of weight to lose per week is usually 1-2 lb (0.5-0.9 kg). This usually means that your daily calorie intake should be reduced by 500-750 calories.  Eating 1,200 - 1,500 calories per day can help most women lose weight.  Eating 1,500 - 1,800 calories per day can help most men lose weight.  What is my plan? My goal is to have __1800________ calories per day. If I have this many calories per day, I should lose around _____1_____ pounds per week. What do I need to know about calorie counting? In order to meet your daily calorie goal, you will need to:  Find out how many calories are in each food you would like to eat. Try to do this before you eat.  Decide how much of the food you plan to  eat.  Write down what you ate and how many calories it had. Doing this is called keeping a food log.  To successfully lose weight, it is important to balance calorie counting with a healthy lifestyle that includes regular activity. Aim for 150 minutes of moderate exercise (such as walking) or 75 minutes of vigorous exercise (such as running) each week. Where do I find calorie information?  The number of calories in a food can be found on a Nutrition Facts label. If a food does not have a Nutrition Facts label, try to look up the calories online or ask your dietitian for help. Remember that calories are listed per serving. If you choose to have more than one serving of a food, you will have to multiply the calories per serving by the amount of servings you plan to eat. For example, the label on a package of bread might say that a serving size is 1 slice and that there are 90 calories in a serving. If you eat 1 slice, you will have eaten 90 calories. If you eat 2 slices, you will have eaten 180 calories. How do I keep a food log? Immediately after each meal, record the following information in your food log:  What you ate. Don't forget to include toppings, sauces, and other extras on the food.  How much you ate. This can be measured  in cups, ounces, or number of items.  How many calories each food and drink had.  The total number of calories in the meal.  Keep your food log near you, such as in a small notebook in your pocket, or use a mobile app or website. Some programs will calculate calories for you and show you how many calories you have left for the day to meet your goal. What are some calorie counting tips?  Use your calories on foods and drinks that will fill you up and not leave you hungry: ? Some examples of foods that fill you up are nuts and nut butters, vegetables, lean proteins, and high-fiber foods like whole grains. High-fiber foods are foods with more than 5 g fiber per  serving. ? Drinks such as sodas, specialty coffee drinks, alcohol, and juices have a lot of calories, yet do not fill you up.  Eat nutritious foods and avoid empty calories. Empty calories are calories you get from foods or beverages that do not have many vitamins or protein, such as candy, sweets, and soda. It is better to have a nutritious high-calorie food (such as an avocado) than a food with few nutrients (such as a bag of chips).  Know how many calories are in the foods you eat most often. This will help you calculate calorie counts faster.  Pay attention to calories in drinks. Low-calorie drinks include water and unsweetened drinks.  Pay attention to nutrition labels for "low fat" or "fat free" foods. These foods sometimes have the same amount of calories or more calories than the full fat versions. They also often have added sugar, starch, or salt, to make up for flavor that was removed with the fat.  Find a way of tracking calories that works for you. Get creative. Try different apps or programs if writing down calories does not work for you. What are some portion control tips?  Know how many calories are in a serving. This will help you know how many servings of a certain food you can have.  Use a measuring cup to measure serving sizes. You could also try weighing out portions on a kitchen scale. With time, you will be able to estimate serving sizes for some foods.  Take some time to put servings of different foods on your favorite plates, bowls, and cups so you know what a serving looks like.  Try not to eat straight from a bag or box. Doing this can lead to overeating. Put the amount you would like to eat in a cup or on a plate to make sure you are eating the right portion.  Use smaller plates, glasses, and bowls to prevent overeating.  Try not to multitask (for example, watch TV or use your computer) while eating. If it is time to eat, sit down at a table and enjoy your food.  This will help you to know when you are full. It will also help you to be aware of what you are eating and how much you are eating. What are tips for following this plan? Reading food labels  Check the calorie count compared to the serving size. The serving size may be smaller than what you are used to eating.  Check the source of the calories. Make sure the food you are eating is high in vitamins and protein and low in saturated and trans fats. Shopping  Read nutrition labels while you shop. This will help you make healthy decisions before you decide to  purchase your food.  Make a grocery list and stick to it. Cooking  Try to cook your favorite foods in a healthier way. For example, try baking instead of frying.  Use low-fat dairy products. Meal planning  Use more fruits and vegetables. Half of your plate should be fruits and vegetables.  Include lean proteins like poultry and fish. How do I count calories when eating out?  Ask for smaller portion sizes.  Consider sharing an entree and sides instead of getting your own entree.  If you get your own entree, eat only half. Ask for a box at the beginning of your meal and put the rest of your entree in it so you are not tempted to eat it.  If calories are listed on the menu, choose the lower calorie options.  Choose dishes that include vegetables, fruits, whole grains, low-fat dairy products, and lean protein.  Choose items that are boiled, broiled, grilled, or steamed. Stay away from items that are buttered, battered, fried, or served with cream sauce. Items labeled "crispy" are usually fried, unless stated otherwise.  Choose water, low-fat milk, unsweetened iced tea, or other drinks without added sugar. If you want an alcoholic beverage, choose a lower calorie option such as a glass of wine or light beer.  Ask for dressings, sauces, and syrups on the side. These are usually high in calories, so you should limit the amount you  eat.  If you want a salad, choose a garden salad and ask for grilled meats. Avoid extra toppings like bacon, cheese, or fried items. Ask for the dressing on the side, or ask for olive oil and vinegar or lemon to use as dressing.  Estimate how many servings of a food you are given. For example, a serving of cooked rice is  cup or about the size of half a baseball. Knowing serving sizes will help you be aware of how much food you are eating at restaurants. The list below tells you how big or small some common portion sizes are based on everyday objects: ? 1 oz-4 stacked dice. ? 3 oz-1 deck of cards. ? 1 tsp-1 die. ? 1 Tbsp- a ping-pong ball. ? 2 Tbsp-1 ping-pong ball. ?  cup- baseball. ? 1 cup-1 baseball. Summary  Calorie counting means keeping track of how many calories you eat and drink each day. If you eat fewer calories than your body needs, you should lose weight.  A healthy amount of weight to lose per week is usually 1-2 lb (0.5-0.9 kg). This usually means reducing your daily calorie intake by 500-750 calories.  The number of calories in a food can be found on a Nutrition Facts label. If a food does not have a Nutrition Facts label, try to look up the calories online or ask your dietitian for help.  Use your calories on foods and drinks that will fill you up, and not on foods and drinks that will leave you hungry.  Use smaller plates, glasses, and bowls to prevent overeating. This information is not intended to replace advice given to you by your health care provider. Make sure you discuss any questions you have with your health care provider. Document Released: 11/13/2005 Document Revised: 10/13/2016 Document Reviewed: 10/13/2016 Elsevier Interactive Patient Education  [employer].

## 2018-09-23 ENCOUNTER — Telehealth: Payer: Self-pay | Admitting: Nurse Practitioner

## 2018-09-23 DIAGNOSIS — J020 Streptococcal pharyngitis: Secondary | ICD-10-CM

## 2018-09-23 DIAGNOSIS — J039 Acute tonsillitis, unspecified: Secondary | ICD-10-CM

## 2018-09-23 MED ORDER — AMOXICILLIN 875 MG PO TABS: 875.0000 mg | ORAL_TABLET | Freq: Two times a day (BID) | ORAL | 0 refills | Status: DC

## 2018-09-23 NOTE — Telephone Encounter (Signed)
Pt stated she got better since 09/06/2018. Since yesterday all symptoms came back again ( right ear pain going toward jar line, bodyache,fever,chill,sore throat). Please advise.

## 2018-09-23 NOTE — Telephone Encounter (Signed)
Patient husband Andrea [name] - on [place]) came into. Patient husband stated his wife is requesting a refill on amoxicillin. Due to patient breast feeding, this helps clear up illness faster. Please call patient at [phone].

## 2018-09-23 NOTE — Telephone Encounter (Signed)
Pt is aware.  

## 2018-10-14 ENCOUNTER — Telehealth: Payer: Self-pay | Admitting: Nurse Practitioner

## 2018-10-14 DIAGNOSIS — K644 Residual hemorrhoidal skin tags: Secondary | ICD-10-CM

## 2018-10-14 NOTE — Telephone Encounter (Signed)
Pt stated hydrocortisone cream is not helping instead it made her pain worse. Please advise,

## 2018-10-14 NOTE — Telephone Encounter (Signed)
Copied from CRM [id number]. Topic: Quick Communication - See Telephone Encounter >> Oct 14, 2018 10:25 AM Windy KalataMichael, Yaakov Saindon L, NT wrote: CRM for notification. See Telephone encounter for: 10/14/18.  Patient is calling and requesting to speak with Alysia Pennaharlotte Nche in regards to hydrocortisone (ANUSOL-HC) 2.5 % rectal cream. Patient states this is not working for her and would like to discuss something in place.

## 2018-10-14 NOTE — Telephone Encounter (Signed)
I do not know of any other topical agent to reduce a hemorrhoid. I can refer to GI to discuss possible banding. Let me know if she is of with referral

## 2018-10-14 NOTE — Telephone Encounter (Signed)
Pt agree to see GI doctor. Referral entered.

## 2018-10-21 ENCOUNTER — Telehealth: Payer: Self-pay | Admitting: Nurse Practitioner

## 2018-10-21 NOTE — Telephone Encounter (Signed)
Referral care coordinator please help, can you check other locations to help the pt. Please help    Copied from CRM [id number]. Topic: General - Inquiry >> Oct 21, 2018 10:56 AM Lynne LoganHudson, Caryn D wrote: Reason for CRM: Pt stated PCP Nche gave a referral regarding her hemorrhoid however pt cannot be seen unti January 2020. Pt stated that is too long to wait as she is in quite some discomfort. She would like to talk to Nche about being referred to another location. [name] requesting a [phone]

## 2018-10-23 ENCOUNTER — Encounter: Payer: Self-pay | Admitting: Gastroenterology

## 2018-10-23 NOTE — Telephone Encounter (Signed)
Spoke with the pt, she report constant pain about 9 out of 10 and it gets worse at night. Pt stated when she use Anusol its burns and no relieve from this at all.   Dr. Dayton MartesAron advise pt to go to the ED for evaluation since Anusol is not helping and the pt is in pain 9 out of 10.   Pt verbalized understand to go to the ED.

## 2018-10-23 NOTE — Telephone Encounter (Signed)
Since I have not evaluated her and pain is a 9/10, I have no way of knowing that she doesn't have a thrombosed hemorrhoid.  I therefore do recommend urgent evaluation.

## 2018-10-23 NOTE — Telephone Encounter (Signed)
Is she referring to anusol that didn't work?

## 2018-10-23 NOTE — Telephone Encounter (Signed)
I called and spoke to patient. I informed patient that I have called to other GI offices and have been unable to schedule sooner than January 2020. I did notice that patient had schedule an appointment today with [facility] for 11/25/18. I informed patient that she can call there office an asked to be placed on a wait list/canncelation to see if she can get in sooner. Patient stated that she would call there office to make that request.

## 2018-10-23 NOTE — Telephone Encounter (Signed)
Pt was wondering if provider can recommend something to help with hemorrhoid while she is waiting for referral ( med charlotte gave her is not helping). Please advise in Charlotte's absence.    Referral care coordinator is working on referral and will call the pt.

## 2018-10-23 NOTE — Telephone Encounter (Signed)
Pt call in today to Speak with Dr. Nche concerning the referrElease Etienneal given/ Adv pt Message was sent to Dr. Elease EtienneNche/ [name] requesting a call back Cb#:[phone]

## 2018-11-25 ENCOUNTER — Ambulatory Visit: Payer: BLUE CROSS/BLUE SHIELD | Admitting: Gastroenterology

## 2018-11-25 ENCOUNTER — Encounter: Payer: Self-pay | Admitting: Gastroenterology

## 2018-11-25 VITALS — BP 100/60 | HR 80 | Ht 58.75 in | Wt 173.0 lb

## 2018-11-25 DIAGNOSIS — K602 Anal fissure, unspecified: Secondary | ICD-10-CM | POA: Diagnosis not present

## 2018-11-25 MED ORDER — AMBULATORY NON FORMULARY MEDICATION: 1 refills | Status: DC

## 2018-11-25 NOTE — Patient Instructions (Addendum)
We have sent a prescription for nitroglycerin 0.125% gel to [facility]. You should apply a pea size amount to your rectum three times daily x 6-8 weeks.  [facility]'s information is below: Address: [address], [place], [place] [address]  [phone]  *Please DO NOT go directly from our office to pick up this medication! Give the pharmacy 1 day to process the prescription as this is compounded at takes time to make.  I gave you a brochure about anal fissures today.   Anal fissure recommendations include: 1. Sitz bath after a bowel movement as needed (epsome salt in a bathtub with warm water, sit 10 minutes up to twice a day as needed) 2. Citrucel or Metamucil 1 tblsp daily, titrate to a soft, formed stool 3. Miralax 1 capful daily if needed in addition to #2 to keep stools soft 4. Sit to move bowels no longer than 5 minutes. Resist straining as possible 5. Before wiping after a bowel movement, swipe the paper into a jar of vaseline to give the paper a slight covering of the vaseline until fully healed. This will help reduce irriation and discomfort to the area. 6. Call if no improvement in 6 weeks

## 2018-11-25 NOTE — Progress Notes (Signed)
Referring Provider: Anne NgNche, Charlotte Lum, NP Primary Care Physician:  Anne NgNche, Charlotte Lum, NP   Reason for Consultation: Lambert ModySharp pain in my anus   IMPRESSION:  Anal fissure  PLAN: Sitz baths two to three times daily High fiber diet - increasing both dietary fiber (35 grams daily) and water intake  Citrucel or Metamucil daily, add Miralax 17 g daily if needed to keep stools soft Nitroglycerin 0.125% gel applied to the rectum TID x 6-8 weeks Return in 6-8 weeks if symptoms are not improving    HPI: Andrea [name] is a 32 y.o. female seen in consultation at the request of Dr. Elease EtienneNche for further evaluation of external hemorrhoids.  The history is obtained through the patient and review of her electronic health record.  Her husband and children accompany her to this appointment.  Experiencing rectal pain for 2 months that has been attributed to external hemorrhoids.  Some rectal burning. Initially associated with blood on the toilet paper. Initially better. Daily BM. Denies constipation. Some straining. No other associated symptoms. No history of similar symptoms.   Tylenol for the pain with minimal relief. Has not tried stool softeners or other medical therapies.  No other identified exacerbating or relieving features.     Past Medical History:  Diagnosis Date  . VHQIONGE(952.8Headache(784.0)     Past Surgical History:  Procedure Laterality Date  . NO PAST SURGERIES      Current Outpatient Medications  Medication Sig Dispense Refill  . Acetaminophen (TYLENOL EXTRA STRENGTH PO) Take 1,000 mg by mouth every 6 (six) hours as needed (pain/ headache).     [name]. ibuprofen (ADVIL,MOTRIN) 600 MG tablet Take 1 tablet (600 mg total) by mouth 3 (three) times daily. With food 30 tablet 0  . Prenatal Vit-Fe Fumarate-FA (PRENATAL MULTIVITAMIN) TABS Take 1 tablet by mouth at bedtime.    . AMBULATORY NON FORMULARY MEDICATION Medication Name: nitroglycerin 0.125 mg apply a pea size amount rectally three times a  day for 6-8 weeks 1 Tube 1   No current facility-administered medications for this visit.     Allergies as of 11/25/2018  . (No Known Allergies)    Family History  Problem Relation Age of Onset  . Hepatitis C Mother     Social History   Socioeconomic History  . Marital status: Married    Spouse name: Not on file  . Number of children: 2  . Years of education: Not on file  . Highest education level: Not on file  Occupational History    Comment: housewife  Social Needs  . Financial resource strain: Not on file  . Food insecurity:    Worry: Not on file    Inability: Not on file  . Transportation needs:    Medical: Not on file    Non-medical: Not on file  Tobacco Use  . Smoking status: Never Smoker  . Smokeless tobacco: Never Used  Substance and Sexual Activity  . Alcohol use: No  . Drug use: No  . Sexual activity: Yes    Birth control/protection: None  Lifestyle  . Physical activity:    Days per week: Not on file    Minutes per session: Not on file  . Stress: Not on file  Relationships  . Social connections:    Talks on phone: Not on file    Gets together: Not on file    Attends religious service: Not on file    Active member of club or organization: Not on file  Attends meetings of clubs or organizations: Not on file    Relationship status: Not on file  . Intimate partner violence:    Fear of current or ex partner: Not on file    Emotionally abused: Not on file    Physically abused: Not on file    Forced sexual activity: Not on file  Other Topics Concern  . Not on file  Social History Narrative  . Not on file    Review of Systems: 12 system ROS is negative except as noted above.  Filed Weights   11/25/18 1601  Weight: 173 lb (78.5 kg)    Physical Exam: Vital signs were reviewed. General:   Alert, well-nourished, pleasant and cooperative in NAD Head:  Normocephalic and atraumatic. Eyes:  Sclera clear, no icterus.   Conjunctiva pink. Mouth:  No  deformity or lesions.   Neck:  Supple; no thyromegaly. Lungs:  Clear throughout to auscultation.   No wheezes.  Heart:  Regular rate and rhythm; no murmurs Abdomen:  Soft, nontender, normal bowel sounds. No rebound or guarding. No hepatosplenomegaly Rectal:  No chemical dermatitis. There is a posterior anal fissure with evidence for recent bleeding. No external hemorrhoids or fistula. No prolapsing hemorrhoids. No rectal prolapse. Normal anocutaneous reflex. No stool in the rectal vault. No mass or fecal impaction. Normal anal resting tone.  Msk:  Symmetrical without gross deformities. Extremities:  No gross deformities or edema. Neurologic:  Alert and  oriented x4;  grossly nonfocal Skin:  No rash or bruise. Psych:  Alert and cooperative. Normal mood and affect.   Asmi Fugere L. Orvan FalconerBeavers, MD, MPH [facility] 12/02/2018, 9:54 AM

## 2019-01-20 ENCOUNTER — Telehealth: Payer: Self-pay | Admitting: Gastroenterology

## 2019-01-20 NOTE — Telephone Encounter (Signed)
Pt called in and stated that she was advised to call back if she is still having the same symptoms after using the cream nitroglycerin 0.125 mg that was giving to her by Dr.Beavers. she will like to speak with the nurse for advice.

## 2019-01-20 NOTE — Telephone Encounter (Signed)
Patient reports that she has not had improvement with her anal fissure using NTG cream.  [name] advised that we need to have her schedule an office visit for a recheck of the fissure.  [name] offered an appt for Wed or Friday of this week with Dr. Orvan Falconer. She needs to try and secure childcare.  She wants to call back to schedule a visit.

## 2019-01-29 ENCOUNTER — Ambulatory Visit: Payer: Self-pay | Admitting: Nurse Practitioner

## 2019-01-29 NOTE — Telephone Encounter (Signed)
I returned her call.   [name] concerned about having a heavy period for 2 weeks now.   She had a baby Feb. 2019.   Not on birth control.   She mentioned she "does not have normal or regular periods".   It was 8 months after giving birth before she had a period.   [name] still breastfeeding.  While looking for an appt with Alysia Penna, NP I had an appt for today at 3:14.   [name] going to call her husband and see if he can get off of work early and keep the baby.   If he can she is going to call us back and take the today appt.   She said she would call back either way.   I let her know if this afternoon did not work out [employer] has an appt at 8:00 in the morning.   She verbalized she would call us back.    Reason for Disposition . [1] Periods with > 6 soaked pads or tampons per day AND [2] last > 7 days  Answer Assessment - Initial Assessment Questions 1. AMOUNT: "Describe the bleeding that you are having."    - SPOTTING: spotting, or pinkish / brownish mucous discharge; does not fill panti-liner or pad    - MILD:  less than 1 pad / hour; less than patient's usual menstrual bleeding   - MODERATE: 1-2 pads / hour; 1 menstrual cup every 6 hours; small-medium blood clots (e.g., pea, grape, small coin)   - SEVERE: soaking 2 or more pads/hour for 2 or more hours; 1 menstrual cup every 2 hours; bleeding not contained by pads or continuous red blood from vagina; large blood clots (e.g., golf ball, large coin)      Change pads 4-5 times a day.    Normally I don't have a normal period for a long time.   I had a baby a year ago.      2. ONSET: "When did the bleeding begin?" "Is it continuing now?"     2 weeks ago.   It's usually a week and I'm done.   I'm still bleeding now. 3. MENSTRUAL PERIOD: "When was the last normal menstrual period?" "How is this different than your period?"     I don't have normal periods.   No pain or cramping.   4. REGULARITY: "How regular are your periods?"     "I  don't have regular periods even before the baby".   Sometimes go 2-3 months without a period.   I have to go to the doctor and get medicine to make me have a period.   After baby I went 8 months with out a period. 5. ABDOMINAL PAIN: "Do you have any pain?" "How bad is the pain?"  (e.g., Scale 1-10; mild, moderate, or severe)   - MILD (1-3): doesn't interfere with normal activities, abdomen soft and not tender to touch    - MODERATE (4-7): interferes with normal activities or awakens from sleep, tender to touch    - SEVERE (8-10): excruciating pain, doubled over, unable to do any normal activities      No 6. PREGNANCY: "Could you be pregnant?" "Are you sexually active?" "Did you recently give birth?"     I don't think so.   Gave birth a year ago. 7. BREASTFEEDING: "Are you breastfeeding?"     Yes 8. HORMONES: "Are you taking any hormone medications, prescription or OTC?" (e.g., birth control pills, estrogen)  No 9. BLOOD THINNERS: "Do you take any blood thinners?" (e.g., Coumadin/warfarin, Pradaxa/dabigatran, aspirin)     No 10. CAUSE: "What do you think is causing the bleeding?" (e.g., recent gyn surgery, recent gyn procedure; known bleeding disorder, cervical cancer, polycystic ovarian disease, fibroids)         I have no idea.    11. HEMODYNAMIC STATUS: "Are you weak or feeling lightheaded?" If so, ask: "Can you stand and walk normally?"        Sometimes I feel weak. 12. OTHER SYMPTOMS: "What other symptoms are you having with the bleeding?" (e.g., passed tissue, vaginal discharge, fever, menstrual-type cramps)       No.  Sometimes it feels thick.  Protocols used: VAGINAL BLEEDING - ABNORMAL-A-AH

## 2019-03-11 ENCOUNTER — Ambulatory Visit: Payer: Self-pay | Admitting: *Deleted

## 2019-03-11 NOTE — Telephone Encounter (Signed)
FYI

## 2019-03-11 NOTE — Telephone Encounter (Signed)
Pt called stating that she was pregnant for a few weeks but she lost the baby; she is now having heavy bleeding; the pt says that her bleeding started on 03/09/2019; she says that she was 6-[redacted] weeks pregnant, and she does not have a OB-GYN; she rates her pain at 5 out of 10, advil has helped; recommendations made per nurse triage protocol; the pt verbalizes understanding and will have her husband take her to the ED; pt normally seen by Alysia Penna, [facility]; will route to office for notification of this encounter.    Reason for Disposition . [1] MODERATE vaginal bleeding (i.e., soaking 1 pad / hour; clots) AND [2] present > 6 hours  Answer Assessment - Initial Assessment Questions 1. ONSET: "When did this bleeding start?"       03/09/2019 2. DESCRIPTION: "Describe the bleeding that you are having." "How much bleeding is there?"    - SPOTTING: spotting, or pinkish / brownish mucous discharge; does not fill panti-liner or pad    - MILD:  less than 1 pad / hour; less than patient's usual menstrual bleeding   - MODERATE: 1-2 pads / hour; small-medium blood clots (e.g., pea, grape, small coin)    - SEVERE: soaking 2 or more pads/hour for 2 or more hours; bleeding not contained by pads or continuous red blood from vagina; large blood clots (e.g., golf ball, large coin)      Severe; changing pads every hour 3. ABDOMINAL PAIN SEVERITY: If present, ask: "How bad is it?"  (e.g., Scale 1-10; mild, moderate, or severe)   - MILD (1-3): doesn't interfere with normal activities, abdomen soft and not tender to touch    - MODERATE (4-7): interferes with normal activities or awakens from sleep, tender to touch    - SEVERE (8-10): excruciating pain, doubled over, unable to do any normal activities     Rated 5 out of 10 4. PREGNANCY: "Do you know how many weeks or months pregnant you are?" "When was the first day of your last normal menstrual period?"     6-7 week; not sure of LMP 5. HEMODYNAMIC STATUS: "Are  you weak or feeling lightheaded?" If so, ask: "Can you stand and walk normally?"      Light headed, feels weak 6. OTHER SYMPTOMS: "What other symptoms are you having with the bleeding?" (e.g., passed tissue, vaginal discharge, fever, menstrual-type cramps)  passed tissue; cramping  Protocols used: PREGNANCY - VAGINAL BLEEDING LESS THAN [redacted] WEEKS EGA-A-AH

## 2019-03-17 ENCOUNTER — Encounter: Payer: Self-pay | Admitting: Nurse Practitioner

## 2019-03-17 ENCOUNTER — Telehealth: Payer: Self-pay | Admitting: Nurse Practitioner

## 2019-03-17 ENCOUNTER — Ambulatory Visit (INDEPENDENT_AMBULATORY_CARE_PROVIDER_SITE_OTHER): Payer: BLUE CROSS/BLUE SHIELD | Admitting: Nurse Practitioner

## 2019-03-17 VITALS — Ht 58.75 in

## 2019-03-17 DIAGNOSIS — N939 Abnormal uterine and vaginal bleeding, unspecified: Secondary | ICD-10-CM

## 2019-03-17 DIAGNOSIS — Z3200 Encounter for pregnancy test, result unknown: Secondary | ICD-10-CM | POA: Diagnosis not present

## 2019-03-17 NOTE — Telephone Encounter (Signed)
I called patient to schedule CPE per [facility] request. Patient will call back to schedule.

## 2019-03-17 NOTE — Progress Notes (Signed)
Virtual Visit via Video Note  I connected with Andrea [name] on 03/17/19 at  4:30 PM EDT by a video enabled telemedicine application and verified that I am speaking with the correct person using two identifiers.   I discussed the limitations of evaluation and management by telemedicine and the availability of in person appointments. The patient expressed understanding and agreed to proceed.  History of Present Illness: Vaginal Bleeding  The patient's primary symptoms include vaginal bleeding. The patient's pertinent negatives include no genital odor, missed menses, pelvic pain or vaginal discharge. This is a new problem. The current episode started in the past 7 days. The problem occurs intermittently. The problem has been gradually improving. The patient is experiencing no pain. Pertinent negatives include no abdominal pain, anorexia, back pain, chills, dysuria, fever, flank pain, nausea or urgency. The vaginal discharge was bloody. The vaginal bleeding is heavier than menses. She has been passing clots. She has been passing tissue. Nothing aggravates the symptoms. She has tried nothing for the symptoms. [name] not sexually active. She uses nothing for contraception. Her past medical history is significant for miscarriage and vaginosis. There is no history of PID or an STD.  LMP 78months ago (unable to remember exact date) Found out she was pregnant [redacted]weeks ago. Vaginal bleed (03/09/19 to 03/14/2019). No hx of miscarriage prior to this. Has no OBGYN   Observations/Objective: Unable to provide any vital signs. Physical Exam  Constitutional: [name] oriented to person, place, and time.  Pulmonary/Chest: Effort normal.  Neurological: [name] alert and oriented to person, place, and time.   Assessment and Plan: Andrea [name] was seen today for miscarriage.  Diagnoses and all orders for this visit:  Abnormal vaginal bleeding -     CBC w/Diff -     TSH -     hCG, quantitative, pregnancy  Possible  pregnancy   Follow Up Instructions: Return to lab tomorrow before 3:30pm for blood draw. Continue prenatal multivitamins. Go to hospital if vaginal bleeding and ABD pain resume.   I discussed the assessment and treatment plan with the patient. The patient was provided an opportunity to ask questions and all were answered. The patient agreed with the plan and demonstrated an understanding of the instructions.   The patient was advised to call back or seek an in-person evaluation if the symptoms worsen or if the condition fails to improve as anticipated.  Alysia Penna, NP

## 2019-03-17 NOTE — Patient Instructions (Signed)
Return to lab tomorrow before 3:30pm for blood draw. Continue prenatal multivitamins. Go to hospital if vaginal bleeding and ABD pain resume.

## 2019-03-18 ENCOUNTER — Other Ambulatory Visit (INDEPENDENT_AMBULATORY_CARE_PROVIDER_SITE_OTHER): Payer: BLUE CROSS/BLUE SHIELD

## 2019-03-18 DIAGNOSIS — N939 Abnormal uterine and vaginal bleeding, unspecified: Secondary | ICD-10-CM | POA: Diagnosis not present

## 2019-03-18 LAB — CBC WITH DIFFERENTIAL/PLATELET
Basophils Absolute: 0 10*3/uL (ref 0.0–0.1)
Basophils Relative: 0.5 % (ref 0.0–3.0)
Eosinophils Absolute: 0.2 10*3/uL (ref 0.0–0.7)
Eosinophils Relative: 3.6 % (ref 0.0–5.0)
HCT: 40.4 % (ref 36.0–46.0)
Hemoglobin: 13.5 g/dL (ref 12.0–15.0)
Lymphocytes Relative: 60.4 % — ABNORMAL HIGH (ref 12.0–46.0)
Lymphs Abs: 2.9 10*3/uL (ref 0.7–4.0)
MCHC: 33.4 g/dL (ref 30.0–36.0)
MCV: 85.8 fl (ref 78.0–100.0)
Monocytes Absolute: 0.3 10*3/uL (ref 0.1–1.0)
Monocytes Relative: 5.3 % (ref 3.0–12.0)
Neutro Abs: 1.5 10*3/uL (ref 1.4–7.7)
Neutrophils Relative %: 31.5 % — ABNORMAL LOW (ref 43.0–77.0)
Platelets: 265 10*3/uL (ref 150.0–400.0)
RBC: 4.71 Mil/uL (ref 3.87–5.11)
RDW: 14 % (ref 11.5–15.5)
WBC: 4.9 10*3/uL (ref 4.0–10.5)

## 2019-03-18 LAB — HCG, QUANTITATIVE, PREGNANCY: Quantitative HCG: <0.6 m[IU]/mL

## 2019-03-18 LAB — TSH: TSH: 0.88 u[IU]/mL (ref 0.35–4.50)

## 2019-03-25 ENCOUNTER — Encounter: Payer: Self-pay | Admitting: Nurse Practitioner

## 2019-03-25 ENCOUNTER — Telehealth: Payer: Self-pay | Admitting: Nurse Practitioner

## 2019-03-25 NOTE — Telephone Encounter (Signed)
I called and spoke to patient, patient scheduled for a virtual visit for 03/26/2019 @ 3pm with [facility].

## 2019-03-25 NOTE — Telephone Encounter (Signed)
LVM for the pt to call back, need to inform her of test result. Ok for triage nurse to give detail message.

## 2019-03-25 NOTE — Telephone Encounter (Signed)
No, there is no way to tell how far along her pregnancy was prior to miscarriage.  An increase in lymphocytes in absence of elevated WBC indicates response to acute infection or inflammatory response like recent miscarriage.

## 2019-03-25 NOTE — Telephone Encounter (Signed)
This encounter was created in error - please disregard.

## 2019-03-25 NOTE — Telephone Encounter (Signed)
Andrea [name] please help call the pt to set up virtual visit with [facility].

## 2019-03-25 NOTE — Telephone Encounter (Signed)
Pt was wondering what this mean from her lab on 03/18/2019 and are we able to tell how long she was pregnant before she had miscarriage from the blood test. Please advise  Lymphocytes Relative 12.0 - 46.0 % 60.4 Repeated and verified X2.High       Copied from CRM [id number]. Topic: General - Other >> Mar 24, 2019  3:06 PM Fanny Bien wrote: Reason for CRM: pt called and stated that she received labs in [place] and would like a call back explaining them. Please advise

## 2019-03-25 NOTE — Telephone Encounter (Signed)
Message from C. NChe read to patient; verbalizes understanding. Pt states she had discussed "A weight loss medication" at previous visit and would like to know if she could start on that now.  Please advise: [phone]

## 2019-03-26 ENCOUNTER — Ambulatory Visit (INDEPENDENT_AMBULATORY_CARE_PROVIDER_SITE_OTHER): Payer: BLUE CROSS/BLUE SHIELD | Admitting: Nurse Practitioner

## 2019-03-26 ENCOUNTER — Encounter: Payer: Self-pay | Admitting: Nurse Practitioner

## 2019-03-26 VITALS — Ht 58.75 in | Wt 176.0 lb

## 2019-03-26 DIAGNOSIS — Z6835 Body mass index (BMI) 35.0-35.9, adult: Secondary | ICD-10-CM

## 2019-03-26 DIAGNOSIS — E669 Obesity, unspecified: Secondary | ICD-10-CM | POA: Diagnosis not present

## 2019-03-26 DIAGNOSIS — Z7689 Persons encountering health services in other specified circumstances: Secondary | ICD-10-CM | POA: Diagnosis not present

## 2019-03-26 NOTE — Progress Notes (Signed)
Virtual Visit via Video Note  I connected with Andrea [name] on 03/26/19 at  3:00 PM EDT by a video enabled telemedicine application and verified that I am speaking with the correct person using two identifiers.  Location: Patient: [facility] Provider: Home   I discussed the limitations of evaluation and management by telemedicine and the availability of in person appointments. The patient expressed understanding and agreed to proceed.  History of Present Illness:  Weight management: Heaviest weight: 178lbs Lowest weight: 150lbs (50yrs ago) Weight gain after childbirth. BMI 35.85 Wt Readings from Last 3 Encounters:  03/26/19 176 lb (79.8 kg)  11/25/18 173 lb (78.5 kg)  09/06/18 178 lb (80.7 kg)   Diet: eats per day (breakfast and lunch), skips dinner, small portions. [name] unsure about what changes to make to diet Exercise: 2x/week, treadmill 25-74mins No FHx of obesity, CAD/MI, DM, thyroid dysfunction Denies any anxiety of depression, no ETOH or tobacco use. Denies any insomnia. No weight loss medication use in past. No contraception use at this time. Recent miscarriage, LMP 57months ago (unable to remember exact date)  Depression screen [place] 2/9 03/26/2019 03/17/2019 08/16/2018  Decreased Interest 0 0 0  Down, Depressed, Hopeless 0 0 0  PHQ - 2 Score 0 0 0  Altered sleeping 0 - -  Tired, decreased energy 0 - -  Change in appetite 0 - -  Feeling bad or failure about yourself  0 - -  Trouble concentrating 0 - -  Moving slowly or fidgety/restless 0 - -  Suicidal thoughts 0 - -  PHQ-9 Score 0 - -   Observations/Objective: Provided only weight, no BP or pulse. Physical Exam  Constitutional: [name] oriented to person, place, and time. No distress.  Pulmonary/Chest: Effort normal.  Neurological: [name] alert and oriented to person, place, and time.   Assessment and Plan: Andrea [name] was seen today for weight loss.  Diagnoses and all orders for this visit:  Class 2 obesity  without serious comorbidity with body mass index (BMI) of 35.0 to 35.9 in adult, unspecified obesity type  Encounter for weight management   Follow Up Instructions: Advised about importance to avoid pregnancy if interested in taking an appetite suppressant. She agreed to use condoms regularly. Advised to wait till next menstrual cycle begins to ensure absence of pregnancy. Advised to increased exercise regimen to walking 30-22mins per day, and strengthening exercise video daily. Advised to eat small regular meals, and avoid skipping meals. Recommended weight watchers program while waiting for appt with weight loss clinic [name] to check BP, pulse and report reading via mychart.  referral to nutritionist entered.  I discussed the assessment and treatment plan with the patient. The patient was provided an opportunity to ask questions and all were answered. The patient agreed with the plan and demonstrated an understanding of the instructions.   The patient was advised to call back or seek an in-person evaluation if the symptoms worsen or if the condition fails to improve as anticipated.   Alysia Penna, NP

## 2019-03-26 NOTE — Patient Instructions (Addendum)
Advised about importance to avoid pregnancy if interested in taking an appetite suppressant. She agreed to use condoms regularly. Advised to wait till next menstrual cycle begins to ensure absence of pregnancy. Advised to increased exercise regimen to walking 30-26mins per day, and strengthening exercise video daily. Advised to eat small regular meals, and avoid skipping meals. Recommended weight watchers program while waiting for appt with weight loss clinic [name] to check BP, pulse and report reading via mychart. She will be contacted to schedule appt with nutritionist  Exercising to Lose Weight Exercise is structured, repetitive physical activity to improve fitness and health. Getting regular exercise is important for everyone. It is especially important if you are overweight. Being overweight increases your risk of heart disease, stroke, diabetes, high blood pressure, and several types of cancer. Reducing your calorie intake and exercising can help you lose weight. Exercise is usually categorized as moderate or vigorous intensity. To lose weight, most people need to do a certain amount of moderate-intensity or vigorous-intensity exercise each week. Moderate-intensity exercise  Moderate-intensity exercise is any activity that gets you moving enough to burn at least three times more energy (calories) than if you were sitting. Examples of moderate exercise include:  Walking a mile in 15 minutes.  Doing light yard work.  Biking at an easy pace. Most people should get at least 150 minutes (2 hours and 30 minutes) a week of moderate-intensity exercise to maintain their body weight. Vigorous-intensity exercise Vigorous-intensity exercise is any activity that gets you moving enough to burn at least six times more calories than if you were sitting. When you exercise at this intensity, you should be working hard enough that you are not able to carry on a conversation. Examples of vigorous exercise  include:  Running.  Playing a team sport, such as football, basketball, and soccer.  Jumping rope. Most people should get at least 75 minutes (1 hour and 15 minutes) a week of vigorous-intensity exercise to maintain their body weight. How can exercise affect me? When you exercise enough to burn more calories than you eat, you lose weight. Exercise also reduces body fat and builds muscle. The more muscle you have, the more calories you burn. Exercise also:  Improves mood.  Reduces stress and tension.  Improves your overall fitness, flexibility, and endurance.  Increases bone strength. The amount of exercise you need to lose weight depends on:  Your age.  The type of exercise.  Any health conditions you have.  Your overall physical ability. Talk to your health care provider about how much exercise you need and what types of activities are safe for you. What actions can I take to lose weight? Nutrition   Make changes to your diet as told by your health care provider or diet and nutrition specialist (dietitian). This may include: ? Eating fewer calories. ? Eating more protein. ? Eating less unhealthy fats. ? Eating a diet that includes fresh fruits and vegetables, whole grains, low-fat dairy products, and lean protein. ? Avoiding foods with added fat, salt, and sugar.  Drink plenty of water while you exercise to prevent dehydration or heat stroke. Activity  Choose an activity that you enjoy and set realistic goals. Your health care provider can help you make an exercise plan that works for you.  Exercise at a moderate or vigorous intensity most days of the week. ? The intensity of exercise may vary from person to person. You can tell how intense a workout is for you by paying  attention to your breathing and heartbeat. Most people will notice their breathing and heartbeat get faster with more intense exercise.  Do resistance training twice each week, such  as: ? Push-ups. ? Sit-ups. ? Lifting weights. ? Using resistance bands.  Getting short amounts of exercise can be just as helpful as long structured periods of exercise. If you have trouble finding time to exercise, try to include exercise in your daily routine. ? Get up, stretch, and walk around every 30 minutes throughout the day. ? Go for a walk during your lunch break. ? Park your car farther away from your destination. ? If you take public transportation, get off one stop early and walk the rest of the way. ? Make phone calls while standing up and walking around. ? Take the stairs instead of elevators or escalators.  Wear comfortable clothes and shoes with good support.  Do not exercise so much that you hurt yourself, feel dizzy, or get very short of breath. Where to find more information  U.S. Department of Health and Human Services: ThisPath.fiwww.hhs.gov  Centers for Disease Control and Prevention (CDC): FootballExhibition.com.brwww.cdc.gov Contact a health care provider:  Before starting a new exercise program.  If you have questions or concerns about your weight.  If you have a medical problem that keeps you from exercising. Get help right away if you have any of the following while exercising:  Injury.  Dizziness.  Difficulty breathing or shortness of breath that does not go away when you stop exercising.  Chest pain.  Rapid heartbeat. Summary  Being overweight increases your risk of heart disease, stroke, diabetes, high blood pressure, and several types of cancer.  Losing weight happens when you burn more calories than you eat.  Reducing the amount of calories you eat in addition to getting regular moderate or vigorous exercise each week helps you lose weight. This information is not intended to replace advice given to you by your health care provider. Make sure you discuss any questions you have with your health care provider. Document Released: 12/16/2010 Document Revised: 11/26/2017  Document Reviewed: 11/26/2017 Elsevier Interactive Patient Education  2019 [employer]

## 2019-04-16 ENCOUNTER — Telehealth: Payer: Self-pay | Admitting: Nurse Practitioner

## 2019-04-16 NOTE — Telephone Encounter (Signed)
Claris Gower pleas advise

## 2019-04-16 NOTE — Telephone Encounter (Signed)
Copied from CRM [id number]. Topic: General - Other >> Apr 16, 2019  2:33 PM Leafy Ro wrote: Reason for CRM:pt is calling and it has been 4 weeks and she has not seen her period yet. Pt would like to know if she can start appetite suppressant, [employer]

## 2019-04-16 NOTE — Telephone Encounter (Signed)
Patient returning call. No periods. No contraception. Please advise.

## 2019-04-16 NOTE — Telephone Encounter (Signed)
Please call and clarify. She has or has not had her period? Is she using any method of contraception?

## 2019-04-16 NOTE — Telephone Encounter (Signed)
LVM for the pt to call back.

## 2019-04-17 NOTE — Telephone Encounter (Signed)
Charlotte please advise.  Pt has had no periods and isn't using any contraception.

## 2019-04-18 NOTE — Telephone Encounter (Signed)
Pt is aware.  

## 2019-04-18 NOTE — Telephone Encounter (Signed)
I am not able to prescribed any weight loss medication at this time.

## 2019-05-19 ENCOUNTER — Telehealth: Payer: Self-pay | Admitting: Nurse Practitioner

## 2019-05-19 NOTE — Telephone Encounter (Signed)
done

## 2019-05-19 NOTE — Telephone Encounter (Signed)
Please schedule F90f appt if screen if negative

## 2019-05-19 NOTE — Telephone Encounter (Signed)
Patient calling and is requesting a call back from [place]. States that they have been discussing a medication for a few months now and she is just wanting to know when this would be able to get sent to her pharmacy. Was not aware of the name of the medication, but states that it is for weight loss.

## 2019-05-19 NOTE — Telephone Encounter (Signed)
Spoke with the pt, she stated she wants charlotte to prescribe weight loss med. Pt stated she is very sure that she is not pregnant and she is willing to come to the office to get pregnancy test if Baldo Ash wants her to do.   FYI--pt does not have her period yet since last ov and I mention why charlotte wants her to get her period first before starting weight loss med.

## 2019-05-19 NOTE — Telephone Encounter (Signed)
Questions for Screening COVID-19  Symptom onset: n/a  Travel or Contacts: no  During this illness, did/does the patient experience any of the following symptoms? Fever >100.20F []   Yes [x]   No []   Unknown Subjective fever (felt feverish) []   Yes [x]   No []   Unknown Chills []   Yes [x]   No []   Unknown Muscle aches (myalgia) []   Yes [x]   No []   Unknown Runny nose (rhinorrhea) []   Yes [x]   No []   Unknown Sore throat []   Yes [x]   No []   Unknown Cough (new onset or worsening of chronic cough) []   Yes [x]   No []   Unknown Shortness of breath (dyspnea) []   Yes [x]   No []   Unknown Nausea or vomiting []   Yes [x]   No []   Unknown Headache []   Yes [x]   No []   Unknown Abdominal pain  []   Yes [x]   No []   Unknown Diarrhea (?3 loose/looser than normal stools/24hr period) []   Yes [x]   No []   Unknown Other, specify:  Patient risk factors: Smoker? []   Current []   Former []   Never If female, currently pregnant? []   Yes []   No  Patient Active Problem List   Diagnosis Date Noted  . Class 2 obesity without serious comorbidity with body mass index (BMI) of 35.0 to 35.9 in adult 03/26/2019  . Seasonal allergic rhinitis 08/16/2018  . Constipation 08/16/2018  . External hemorrhoids without complication 96/02/5408  . Amenorrhea 08/16/2018  . Uterine contractions during pregnancy 01/24/2018  . Uterine size date discrepancy pregnancy, second trimester 10/30/2017  . Supervision of other normal pregnancy, antepartum 08/06/2017  . 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 Infertility, anovulation 05/21/2017  . Ovarian mass, right 05/21/2017    Plan:  []   High risk for COVID-19 with red flags go to ED (with CP, SOB, weak/lightheaded, or fever > 101.5). Call ahead.  []   High risk for COVID-19 but stable. Inform provider and coordinate time for [facility] visit.   []   No red flags but URI signs or symptoms okay for [facility] visit.

## 2019-05-20 ENCOUNTER — Ambulatory Visit: Payer: BC Managed Care – PPO | Admitting: Nurse Practitioner

## 2019-05-20 ENCOUNTER — Encounter: Payer: Self-pay | Admitting: Nurse Practitioner

## 2019-05-20 ENCOUNTER — Other Ambulatory Visit: Payer: Self-pay

## 2019-05-20 VITALS — BP 102/64 | HR 80 | Temp 97.8°F | Ht 58.75 in | Wt 185.6 lb

## 2019-05-20 DIAGNOSIS — Z6837 Body mass index (BMI) 37.0-37.9, adult: Secondary | ICD-10-CM | POA: Diagnosis not present

## 2019-05-20 DIAGNOSIS — E6609 Other obesity due to excess calories: Secondary | ICD-10-CM | POA: Diagnosis not present

## 2019-05-20 DIAGNOSIS — Z7689 Persons encountering health services in other specified circumstances: Secondary | ICD-10-CM

## 2019-05-20 DIAGNOSIS — N912 Amenorrhea, unspecified: Secondary | ICD-10-CM

## 2019-05-20 LAB — POCT URINE PREGNANCY: Preg Test, Ur: NEGATIVE

## 2019-05-20 MED ORDER — PHENTERMINE HCL 15 MG PO CAPS: 15.0000 mg | ORAL_CAPSULE | ORAL | 0 refills | Status: DC

## 2019-05-20 NOTE — Assessment & Plan Note (Signed)
Do labs if no menstrual cycle in 36month: FSH, LH, estrogen.  if normal, hold phentermine and do consider progesterone challenge?

## 2019-05-20 NOTE — Progress Notes (Signed)
Subjective:  Patient ID: Andrea [name], female    DOB: 12-13-1985  Age: 33 y.o. MRN: [id number]  CC: Weight Loss (pt request consult about weight loss medication. )  HPI  Amenorrhea since 03/17/2019, after miscarriage. Sexually active with husband, use of condoms.  Obesity: BMI 37 No weight loss noted since last OV. Reports this is her heaviest weight Waiting for appt with weight loss clinic. Never used appetite suppressant in past Exercise: exercise 3x/week 44mins Diet: low fat, low carb,decreased portion size Sleep 10pm to 8am: restful  Wt Readings from Last 3 Encounters:  05/20/19 185 lb 9.6 oz (84.2 kg)  03/26/19 176 lb (79.8 kg)  11/25/18 173 lb (78.5 kg)   Depression screen [facility] 2/9 05/20/2019 03/26/2019 03/17/2019  Decreased Interest 0 0 0  Down, Depressed, Hopeless 0 0 0  PHQ - 2 Score 0 0 0  Altered sleeping 0 0 -  Tired, decreased energy 1 0 -  Change in appetite 0 0 -  Feeling bad or failure about yourself  0 0 -  Trouble concentrating 1 0 -  Moving slowly or fidgety/restless 0 0 -  Suicidal thoughts 0 0 -  PHQ-9 Score 2 0 -   GAD 7 : Generalized Anxiety Score 05/20/2019 01/21/2018 01/14/2018 12/24/2017  Nervous, Anxious, on Edge 0 0 0 0  Control/stop worrying 0 0 0 0  Worry too much - different things 0 0 0 0  Trouble relaxing 0 1 0 0  Restless 0 0 0 0  Easily annoyed or irritable 0 0 0 0  Afraid - awful might happen 0 0 0 0  Total GAD 7 Score 0 1 0 0   Reviewed past Medical, Social and Family history today.  Outpatient Medications Prior to Visit  Medication Sig Dispense Refill  . Acetaminophen (TYLENOL EXTRA STRENGTH PO) Take 1,000 mg by mouth every 6 (six) hours as needed (pain/ headache).     . AMBULATORY NON FORMULARY MEDICATION Medication Name: nitroglycerin 0.125 mg apply a pea size amount rectally three times a day for 6-8 weeks 1 Tube 1  . Prenatal Vit-Fe Fumarate-FA (PRENATAL MULTIVITAMIN) TABS Take 1 tablet by mouth at bedtime.    [name] ibuprofen  (ADVIL,MOTRIN) 600 MG tablet Take 1 tablet (600 mg total) by mouth 3 (three) times daily. With food (Patient not taking: Reported on 03/26/2019) 30 tablet 0   No facility-administered medications prior to visit.     ROS See HPI  Objective:  BP 102/64   Pulse 80   Temp 97.8 F (36.6 C) (Oral)   Ht 4' 10.75" (1.492 m)   Wt 185 lb 9.6 oz (84.2 kg)   LMP 03/17/2019   SpO2 97%   BMI 37.81 kg/m   BP Readings from Last 3 Encounters:  05/20/19 102/64  11/25/18 100/60  09/06/18 108/78    Wt Readings from Last 3 Encounters:  05/20/19 185 lb 9.6 oz (84.2 kg)  03/26/19 176 lb (79.8 kg)  11/25/18 173 lb (78.5 kg)    Physical Exam Constitutional:      Appearance: [name] obese.  Cardiovascular:     Rate and Rhythm: Normal rate and regular rhythm.     Pulses: Normal pulses.     Heart sounds: Normal heart sounds.  Pulmonary:     Effort: Pulmonary effort is normal.     Breath sounds: Normal breath sounds.  Musculoskeletal: Normal range of motion.     Right lower leg: No edema.     Left lower leg:  No edema.  Neurological:     Mental Status: [name] alert and oriented to person, place, and time.  Psychiatric:        Mood and Affect: Mood normal.        Behavior: Behavior normal.        Thought Content: Thought content normal.    Lab Results  Component Value Date   WBC 4.9 03/18/2019   HGB 13.5 03/18/2019   HCT 40.4 03/18/2019   PLT 265.0 03/18/2019   GLUCOSE 85 08/16/2018   CHOL 234 (H) 08/16/2018   TRIG 102.0 08/16/2018   HDL 50.50 08/16/2018   LDLCALC 163 (H) 08/16/2018   ALT 18 08/16/2018   AST 17 08/16/2018   NA 140 08/16/2018   K 4.4 08/16/2018   CL 105 08/16/2018   CREATININE 0.65 08/16/2018   BUN 16 08/16/2018   CO2 27 08/16/2018   TSH 0.88 03/18/2019    Assessment & Plan:  Spent 50% of today's office visit discussing different appetite suppressants, cost and possible side effects. She opted to use phentermine due to low cost.  Andrea [name] was seen today for  weight loss.  Diagnoses and all orders for this visit:  Class 2 obesity due to excess calories without serious comorbidity with body mass index (BMI) of 37.0 to 37.9 in adult -     phentermine 15 MG capsule; Take 1 capsule (15 mg total) by mouth every morning.  Encounter for weight management -     phentermine 15 MG capsule; Take 1 capsule (15 mg total) by mouth every morning.  Amenorrhea -     POCT urine pregnancy   I have discontinued [place] [facility]'s ibuprofen. I am also having her start on phentermine. Additionally, I am having her maintain her prenatal multivitamin, Acetaminophen (TYLENOL EXTRA STRENGTH PO), and AMBULATORY NON FORMULARY MEDICATION.  Meds ordered this encounter  Medications  . phentermine 15 MG capsule    Sig: Take 1 capsule (15 mg total) by mouth every morning.    Dispense:  15 capsule    Refill:  0    Order Specific Question:   Supervising Provider    Answer:   Dianne DunARON, TALIA M [[id number]]    Problem List Items Addressed This Visit      Other   Amenorrhea    Do labs if no menstrual cycle in 30month: FSH, LH, estrogen.  if normal, hold phentermine and do consider progesterone challenge?      Relevant Orders   POCT urine pregnancy    Other Visit Diagnoses    Class 2 obesity due to excess calories without serious comorbidity with body mass index (BMI) of 37.0 to 37.9 in adult    -  Primary   Relevant Medications   phentermine 15 MG capsule   Encounter for weight management       Relevant Medications   phentermine 15 MG capsule      Follow-up: Return in about 4 weeks (around 06/17/2019) for weight management.  Alysia Pennaharlotte Astou Lada, NP

## 2019-05-20 NOTE — Patient Instructions (Signed)
Call office for phentermine refill if no adverse reaction in 2weeks  Calorie Counting for Weight Loss Calories are units of energy. Your body needs a certain amount of calories from food to keep you going throughout the day. When you eat more calories than your body needs, your body stores the extra calories as fat. When you eat fewer calories than your body needs, your body burns fat to get the energy it needs. Calorie counting means keeping track of how many calories you eat and drink each day. Calorie counting can be helpful if you need to lose weight. If you make sure to eat fewer calories than your body needs, you should lose weight. Ask your health care provider what a healthy weight is for you. For calorie counting to work, you will need to eat the right number of calories in a day in order to lose a healthy amount of weight per week. A dietitian can help you determine how many calories you need in a day and will give you suggestions on how to reach your calorie goal.  A healthy amount of weight to lose per week is usually 1-2 lb (0.5-0.9 kg). This usually means that your daily calorie intake should be reduced by 500-750 calories.  Eating 1,200 - 1,500 calories per day can help most women lose weight.  Eating 1,500 - 1,800 calories per day can help most men lose weight. What is my plan? My goal is to have ___1500_______ calories per day. If I have this many calories per day, I should lose around ___1_______ pounds per week. What do I need to know about calorie counting? In order to meet your daily calorie goal, you will need to:  Find out how many calories are in each food you would like to eat. Try to do this before you eat.  Decide how much of the food you plan to eat.  Write down what you ate and how many calories it had. Doing this is called keeping a food log. To successfully lose weight, it is important to balance calorie counting with a healthy lifestyle that includes regular  activity. Aim for 150 minutes of moderate exercise (such as walking) or 75 minutes of vigorous exercise (such as running) each week. Where do I find calorie information?  The number of calories in a food can be found on a Nutrition Facts label. If a food does not have a Nutrition Facts label, try to look up the calories online or ask your dietitian for help. Remember that calories are listed per serving. If you choose to have more than one serving of a food, you will have to multiply the calories per serving by the amount of servings you plan to eat. For example, the label on a package of bread might say that a serving size is 1 slice and that there are 90 calories in a serving. If you eat 1 slice, you will have eaten 90 calories. If you eat 2 slices, you will have eaten 180 calories. How do I keep a food log? Immediately after each meal, record the following information in your food log:  What you ate. Don't forget to include toppings, sauces, and other extras on the food.  How much you ate. This can be measured in cups, ounces, or number of items.  How many calories each food and drink had.  The total number of calories in the meal. Keep your food log near you, such as in a small notebook in your  pocket, or use a mobile app or website. Some programs will calculate calories for you and show you how many calories you have left for the day to meet your goal. What are some calorie counting tips?   Use your calories on foods and drinks that will fill you up and not leave you hungry: ? Some examples of foods that fill you up are nuts and nut butters, vegetables, lean proteins, and high-fiber foods like whole grains. High-fiber foods are foods with more than 5 g fiber per serving. ? Drinks such as sodas, specialty coffee drinks, alcohol, and juices have a lot of calories, yet do not fill you up.  Eat nutritious foods and avoid empty calories. Empty calories are calories you get from foods or  beverages that do not have many vitamins or protein, such as candy, sweets, and soda. It is better to have a nutritious high-calorie food (such as an avocado) than a food with few nutrients (such as a bag of chips).  Know how many calories are in the foods you eat most often. This will help you calculate calorie counts faster.  Pay attention to calories in drinks. Low-calorie drinks include water and unsweetened drinks.  Pay attention to nutrition labels for "low fat" or "fat free" foods. These foods sometimes have the same amount of calories or more calories than the full fat versions. They also often have added sugar, starch, or salt, to make up for flavor that was removed with the fat.  Find a way of tracking calories that works for you. Get creative. Try different apps or programs if writing down calories does not work for you. What are some portion control tips?  Know how many calories are in a serving. This will help you know how many servings of a certain food you can have.  Use a measuring cup to measure serving sizes. You could also try weighing out portions on a kitchen scale. With time, you will be able to estimate serving sizes for some foods.  Take some time to put servings of different foods on your favorite plates, bowls, and cups so you know what a serving looks like.  Try not to eat straight from a bag or box. Doing this can lead to overeating. Put the amount you would like to eat in a cup or on a plate to make sure you are eating the right portion.  Use smaller plates, glasses, and bowls to prevent overeating.  Try not to multitask (for example, watch TV or use your computer) while eating. If it is time to eat, sit down at a table and enjoy your food. This will help you to know when you are full. It will also help you to be aware of what you are eating and how much you are eating. What are tips for following this plan? Reading food labels  Check the calorie count compared  to the serving size. The serving size may be smaller than what you are used to eating.  Check the source of the calories. Make sure the food you are eating is high in vitamins and protein and low in saturated and trans fats. Shopping  Read nutrition labels while you shop. This will help you make healthy decisions before you decide to purchase your food.  Make a grocery list and stick to it. Cooking  Try to cook your favorite foods in a healthier way. For example, try baking instead of frying.  Use low-fat dairy products. Meal planning  Use more fruits and vegetables. Half of your plate should be fruits and vegetables.  Include lean proteins like poultry and fish. How do I count calories when eating out?  Ask for smaller portion sizes.  Consider sharing an entree and sides instead of getting your own entree.  If you get your own entree, eat only half. Ask for a box at the beginning of your meal and put the rest of your entree in it so you are not tempted to eat it.  If calories are listed on the menu, choose the lower calorie options.  Choose dishes that include vegetables, fruits, whole grains, low-fat dairy products, and lean protein.  Choose items that are boiled, broiled, grilled, or steamed. Stay away from items that are buttered, battered, fried, or served with cream sauce. Items labeled "crispy" are usually fried, unless stated otherwise.  Choose water, low-fat milk, unsweetened iced tea, or other drinks without added sugar. If you want an alcoholic beverage, choose a lower calorie option such as a glass of wine or light beer.  Ask for dressings, sauces, and syrups on the side. These are usually high in calories, so you should limit the amount you eat.  If you want a salad, choose a garden salad and ask for grilled meats. Avoid extra toppings like bacon, cheese, or fried items. Ask for the dressing on the side, or ask for olive oil and vinegar or lemon to use as  dressing.  Estimate how many servings of a food you are given. For example, a serving of cooked rice is  cup or about the size of half a baseball. Knowing serving sizes will help you be aware of how much food you are eating at restaurants. The list below tells you how big or small some common portion sizes are based on everyday objects: ? 1 oz--4 stacked dice. ? 3 oz--1 deck of cards. ? 1 tsp--1 die. ? 1 Tbsp-- a ping-pong ball. ? 2 Tbsp--1 ping-pong ball. ?  cup-- baseball. ? 1 cup--1 baseball. Summary  Calorie counting means keeping track of how many calories you eat and drink each day. If you eat fewer calories than your body needs, you should lose weight.  A healthy amount of weight to lose per week is usually 1-2 lb (0.5-0.9 kg). This usually means reducing your daily calorie intake by 500-750 calories.  The number of calories in a food can be found on a Nutrition Facts label. If a food does not have a Nutrition Facts label, try to look up the calories online or ask your dietitian for help.  Use your calories on foods and drinks that will fill you up, and not on foods and drinks that will leave you hungry.  Use smaller plates, glasses, and bowls to prevent overeating. This information is not intended to replace advice given to you by your health care provider. Make sure you discuss any questions you have with your health care provider. Document Released: 11/13/2005 Document Revised: 08/02/2018 Document Reviewed: 10/13/2016 Elsevier Interactive Patient Education  2019 Elsevier Inc.  Phentermine tablets or capsules What is this medicine? PHENTERMINE (FEN ter meen) decreases your appetite. It is used with a reduced calorie diet and exercise to help you lose weight. This medicine may be used for other purposes; ask your health care provider or pharmacist if you have questions. COMMON BRAND NAME(S): Adipex-P, Atti-Plex P, Atti-Plex P Spansule, Fastin, Lomaira, Pro-Fast, Tara-8 What  should I tell my health care provider before I take this  medicine? They need to know if you have any of these conditions: -agitation or nervousness -diabetes -glaucoma -heart disease -high blood pressure -history of drug abuse or addiction -history of stroke -kidney disease -lung disease called Primary Pulmonary Hypertension (PPH) -taken an MAOI like Carbex, Eldepryl, Marplan, Nardil, or Parnate in last 14 days -taking stimulant medicines for attention disorders, weight loss, or to stay awake -thyroid disease -an unusual or allergic reaction to phentermine, other medicines, foods, dyes, or preservatives -pregnant or trying to get pregnant -breast-feeding How should I use this medicine? Take this medicine by mouth with a glass of water. Follow the directions on the prescription label. The instructions for use may differ based on the product and dose you are taking. Avoid taking this medicine in the evening. It may interfere with sleep. Take your doses at regular intervals. Do not take your medicine more often than directed. Talk to your pediatrician regarding the use of this medicine in children. While this drug may be prescribed for children 17 years or older for selected conditions, precautions do apply. Overdosage: If you think you have taken too much of this medicine contact a poison control center or emergency room at once. NOTE: This medicine is only for you. Do not share this medicine with others. What if I miss a dose? If you miss a dose, take it as soon as you can. If it is almost time for your next dose, take only that dose. Do not take double or extra doses. What may interact with this medicine? Do not take this medicine with any of the following medications: -MAOIs like Carbex, Eldepryl, Marplan, Nardil, and Parnate -medicines for colds or breathing difficulties like pseudoephedrine or phenylephrine -procarbazine -sibutramine -stimulant medicines for attention disorders,  weight loss, or to stay awake This medicine may also interact with the following medications: -certain medicines for depression, anxiety, or psychotic disturbances -linezolid -medicines for diabetes -medicines for high blood pressure This list may not describe all possible interactions. Give your health care provider a list of all the medicines, herbs, non-prescription drugs, or dietary supplements you use. Also tell them if you smoke, drink alcohol, or use illegal drugs. Some items may interact with your medicine. What should I watch for while using this medicine? Notify your physician immediately if you become short of breath while doing your normal activities. Do not take this medicine within 6 hours of bedtime. It can keep you from getting to sleep. Avoid drinks that contain caffeine and try to stick to a regular bedtime every night. This medicine was intended to be used in addition to a healthy diet and exercise. The best results are achieved this way. This medicine is only indicated for short-term use. Eventually your weight loss may level out. At that point, the drug will only help you maintain your new weight. Do not increase or in any way change your dose without consulting your doctor. You may get drowsy or dizzy. Do not drive, use machinery, or do anything that needs mental alertness until you know how this medicine affects you. Do not stand or sit up quickly, especially if you are an older patient. This reduces the risk of dizzy or fainting spells. Alcohol may increase dizziness and drowsiness. Avoid alcoholic drinks. What side effects may I notice from receiving this medicine? Side effects that you should report to your doctor or health care professional as soon as possible: -allergic reactions like skin rash, itching or hives, swelling of the face, lips, or tongue) -anxiety -  breathing problems -changes in vision -chest pain or chest tightness -depressed mood or other mood  changes -hallucinations, loss of contact with reality -fast, irregular heartbeat -increased blood pressure -irritable -nervousness or restlessness -painful urination -palpitations -tremors -trouble sleeping -seizures -signs and symptoms of a stroke like changes in vision; confusion; trouble speaking or understanding; severe headaches; sudden numbness or weakness of the face, arm or leg; trouble walking; dizziness; loss of balance or coordination -unusually weak or tired -vomiting Side effects that usually do not require medical attention (report to your doctor or health care professional if they continue or are bothersome): -constipation or diarrhea -dry mouth -headache -nausea -stomach upset -sweating This list may not describe all possible side effects. Call your doctor for medical advice about side effects. You may report side effects to FDA at 1-800-FDA-1088. Where should I keep my medicine? Keep out of the reach of children. This medicine can be abused. Keep your medicine in a safe place to protect it from theft. Do not share this medicine with anyone. Selling or giving away this medicine is dangerous and against the law. This medicine may cause accidental overdose and death if taken by other adults, children, or pets. Mix any unused medicine with a substance like cat litter or coffee grounds. Then throw the medicine away in a sealed container like a sealed bag or a coffee can with a lid. Do not use the medicine after the expiration date. Store at room temperature between 20 and 25 degrees C (68 and 77 degrees F). Keep container tightly closed. NOTE: This sheet is a summary. It may not cover all possible information. If you have questions about this medicine, talk to your doctor, pharmacist, or health care provider.  2019 Elsevier/Gold Standard (2017-04-27 08:23:13)

## 2019-06-02 ENCOUNTER — Other Ambulatory Visit: Payer: Self-pay | Admitting: Nurse Practitioner

## 2019-06-02 ENCOUNTER — Telehealth: Payer: Self-pay | Admitting: Nurse Practitioner

## 2019-06-02 DIAGNOSIS — Z6837 Body mass index (BMI) 37.0-37.9, adult: Secondary | ICD-10-CM

## 2019-06-02 DIAGNOSIS — Z7689 Persons encountering health services in other specified circumstances: Secondary | ICD-10-CM

## 2019-06-02 DIAGNOSIS — E6609 Other obesity due to excess calories: Secondary | ICD-10-CM

## 2019-06-02 MED ORDER — PHENTERMINE HCL 15 MG PO CAPS: 15.0000 mg | ORAL_CAPSULE | ORAL | 0 refills | Status: DC

## 2019-06-02 NOTE — Telephone Encounter (Signed)
Left detail message inform the pt.  

## 2019-06-02 NOTE — Telephone Encounter (Signed)
Pt is calling back request refill for phentamine, pt report no side effects (only moody but she is able to manage it). Please advise, pt has 1 pill left.    Copied from [place] [id number]. Topic: General - Inquiry >> Jun 02, 2019  9:06 AM Virl Axe D wrote: Reason for CRM: Pt would like a callback from [employer]. Pt was instructed that refills can take up to 3 business days and to request them well in advance before running out. Please advise.

## 2019-06-02 NOTE — Telephone Encounter (Signed)
Refill sent. Maintain upcoming appt

## 2019-06-19 ENCOUNTER — Telehealth: Payer: Self-pay | Admitting: Nurse Practitioner

## 2019-06-19 NOTE — Telephone Encounter (Signed)
Questions for Screening COVID-19  Symptom onset: n/a  Travel or Contacts:  Went to [place] on 06/05/2019 -06/10/2019.   During this illness, did/does the patient experience any of the following symptoms? Fever >100.4F []  Yes [x]  No []  Unknown Subjective fever (felt feverish) []  Yes [x]  No []  Unknown Chills []  Yes [x]  No []  Unknown Muscle aches (myalgia) []  Yes [x]  No []  Unknown Runny nose (rhinorrhea) []  Yes [x]  No []  Unknown Sore throat []  Yes [x]  No []  Unknown Cough (new onset or worsening of chronic cough) []  Yes [x]  No []  Unknown Shortness of breath (dyspnea) []  Yes [x]  No []  Unknown Nausea or vomiting []  Yes [x]  No []  Unknown Headache []  Yes [x]  No []  Unknown Abdominal pain  []  Yes [x]  No []  Unknown Diarrhea (?3 loose/looser than normal stools/24hr period) []  Yes [x]  No []  Unknown Other, specify:  

## 2019-06-20 ENCOUNTER — Other Ambulatory Visit: Payer: Self-pay

## 2019-06-20 ENCOUNTER — Ambulatory Visit: Payer: BC Managed Care – PPO | Admitting: Nurse Practitioner

## 2019-06-20 ENCOUNTER — Encounter: Payer: Self-pay | Admitting: Nurse Practitioner

## 2019-06-20 VITALS — BP 110/76 | HR 89 | Temp 97.9°F | Ht 58.76 in | Wt 177.0 lb

## 2019-06-20 DIAGNOSIS — N912 Amenorrhea, unspecified: Secondary | ICD-10-CM

## 2019-06-20 DIAGNOSIS — Z7689 Persons encountering health services in other specified circumstances: Secondary | ICD-10-CM

## 2019-06-20 DIAGNOSIS — E6609 Other obesity due to excess calories: Secondary | ICD-10-CM

## 2019-06-20 DIAGNOSIS — Z6835 Body mass index (BMI) 35.0-35.9, adult: Secondary | ICD-10-CM

## 2019-06-20 DIAGNOSIS — Z6836 Body mass index (BMI) 36.0-36.9, adult: Secondary | ICD-10-CM

## 2019-06-20 DIAGNOSIS — Z6837 Body mass index (BMI) 37.0-37.9, adult: Secondary | ICD-10-CM

## 2019-06-20 DIAGNOSIS — E66812 Obesity, class 2: Secondary | ICD-10-CM

## 2019-06-20 MED ORDER — PHENTERMINE HCL 30 MG PO CAPS: 30.0000 mg | ORAL_CAPSULE | ORAL | 1 refills | Status: DC

## 2019-06-20 NOTE — Progress Notes (Signed)
Subjective:  Patient ID: Andrea [name], female    DOB: 08/22/1986  Age: 33 y.o. MRN: [id number]  CC: Follow-up (4 wk follow up on weight loss--med is helping)  HPI  Weight management: lost 8lbs in last 4weeks with phentermine 15mg , walking 2-3x/week(30-2540mins), small portions and low fat diet. Denies any adverse effects with phentermine.  BP Readings from Last 3 Encounters:  06/20/19 110/76  05/20/19 102/64  11/25/18 100/60   Wt Readings from Last 3 Encounters:  06/20/19 177 lb (80.3 kg)  05/20/19 185 lb 9.6 oz (84.2 kg)  03/26/19 176 lb (79.8 kg)   Amenorrhea: LMP 03/09/2019 Negative urine pregnancy and serum HCG. Sexually active with use of condoms. [facility]: unable to visualize right ovaries.  Reviewed past Medical, Social and Family history today.  Outpatient Medications Prior to Visit  Medication Sig Dispense Refill  . Acetaminophen (TYLENOL EXTRA STRENGTH PO) Take 1,000 mg by mouth every 6 (six) hours as needed (pain/ headache).     . Prenatal Vit-Fe Fumarate-FA (PRENATAL MULTIVITAMIN) TABS Take 1 tablet by mouth at bedtime.    . phentermine 15 MG capsule Take 1 capsule (15 mg total) by mouth every morning. 15 capsule 0  . AMBULATORY NON FORMULARY MEDICATION Medication Name: nitroglycerin 0.125 mg apply a pea size amount rectally three times a day for 6-8 weeks (Patient not taking: Reported on 06/20/2019) 1 Tube 1   No facility-administered medications prior to visit.     ROS See HPI  Objective:  BP 110/76   Pulse 89   Temp 97.9 F (36.6 C) (Oral)   Ht 4' 10.76" (1.493 m)   Wt 177 lb (80.3 kg)   LMP 03/09/2019   SpO2 99%   BMI 36.04 kg/m   BP Readings from Last 3 Encounters:  06/20/19 110/76  05/20/19 102/64  11/25/18 100/60    Wt Readings from Last 3 Encounters:  06/20/19 177 lb (80.3 kg)  05/20/19 185 lb 9.6 oz (84.2 kg)  03/26/19 176 lb (79.8 kg)    Physical Exam Vitals signs reviewed.   Cardiovascular:     Rate and Rhythm: Normal rate and regular rhythm.     Pulses: Normal pulses.     Heart sounds: Normal heart sounds.  Pulmonary:     Effort: Pulmonary effort is normal.  Musculoskeletal:     Right lower leg: No edema.     Left lower leg: No edema.  Neurological:     Mental Status: [name] alert and oriented to person, place, and time.  Psychiatric:        Mood and Affect: Mood normal.        Behavior: Behavior normal.        Thought Content: Thought content normal.     Lab Results  Component Value Date   WBC 4.9 03/18/2019   HGB 13.5 03/18/2019   HCT 40.4 03/18/2019   PLT 265.0 03/18/2019   GLUCOSE 85 08/16/2018   CHOL 234 (H) 08/16/2018   TRIG 102.0 08/16/2018   HDL 50.50 08/16/2018   LDLCALC 163 (H) 08/16/2018   ALT 18 08/16/2018   AST 17 08/16/2018   NA 140 08/16/2018   K 4.4 08/16/2018   CL 105 08/16/2018   CREATININE 0.65 08/16/2018   BUN 16 08/16/2018   CO2 27 08/16/2018   TSH 0.88 03/18/2019    Assessment & Plan:   Andrea [name] was seen today for follow-up.  Diagnoses and all orders for this visit:  Amenorrhea -  US Pelvic Complete With Transvaginal; Future -     FSH; Future -     Estrogens, total; Future -     Estrogens, total -     FSH  Class 2 obesity due to excess calories without serious comorbidity with body mass index (BMI) of 35.0 to 35.9 in adult  BMI 36.0-36.9,adult -     phentermine 30 MG capsule; Take 1 capsule (30 mg total) by mouth every morning.  Class 2 obesity due to excess calories without serious comorbidity with body mass index (BMI) of 37.0 to 37.9 in adult -     phentermine 30 MG capsule; Take 1 capsule (30 mg total) by mouth every morning.  Encounter for weight management -     phentermine 30 MG capsule; Take 1 capsule (30 mg total) by mouth every morning.   I have changed Andrea [name]'s phentermine. I am also having her maintain her prenatal multivitamin, Acetaminophen (TYLENOL EXTRA STRENGTH PO), and  AMBULATORY NON FORMULARY MEDICATION.  Meds ordered this encounter  Medications  . phentermine 30 MG capsule    Sig: Take 1 capsule (30 mg total) by mouth every morning.    Dispense:  30 capsule    Refill:  1    Order Specific Question:   Supervising Provider    Answer:   Lucille Passy [[id number]]    Problem List Items Addressed This Visit      Other   Amenorrhea - Primary   Relevant Orders   US Pelvic Complete With Transvaginal   FSH   Estrogens, total   Class 2 obesity without serious comorbidity with body mass index (BMI) of 35.0 to 35.9 in adult   Relevant Medications   phentermine 30 MG capsule    Other Visit Diagnoses    BMI 36.0-36.9,adult       Relevant Medications   phentermine 30 MG capsule   Class 2 obesity due to excess calories without serious comorbidity with body mass index (BMI) of 37.0 to 37.9 in adult       Relevant Medications   phentermine 30 MG capsule   Encounter for weight management       Relevant Medications   phentermine 30 MG capsule       Follow-up: Return in about 2 months (around 08/21/2019) for weight management.  Wilfred Lacy, NP

## 2019-06-20 NOTE — Patient Instructions (Addendum)
Increased phentermine dose to 30mg .  Return to lab for blood draw. You will be contacted to schedule appt for pelvic US.

## 2019-06-25 LAB — FOLLICLE STIMULATING HORMONE: FSH: 4.8 m[IU]/mL

## 2019-06-25 LAB — ESTROGENS, TOTAL: Estrogen: 534.6 pg/mL

## 2019-07-02 ENCOUNTER — Other Ambulatory Visit: Payer: Self-pay | Admitting: Nurse Practitioner

## 2019-07-02 DIAGNOSIS — N912 Amenorrhea, unspecified: Secondary | ICD-10-CM

## 2019-07-08 ENCOUNTER — Other Ambulatory Visit: Payer: Self-pay

## 2019-07-08 ENCOUNTER — Encounter: Payer: Self-pay | Admitting: Nurse Practitioner

## 2019-07-08 ENCOUNTER — Ambulatory Visit: Payer: BC Managed Care – PPO | Admitting: Nurse Practitioner

## 2019-07-08 VITALS — BP 112/72 | HR 93 | Temp 98.6°F | Ht 58.76 in | Wt 175.8 lb

## 2019-07-08 DIAGNOSIS — H9201 Otalgia, right ear: Secondary | ICD-10-CM

## 2019-07-08 DIAGNOSIS — H6981 Other specified disorders of Eustachian tube, right ear: Secondary | ICD-10-CM

## 2019-07-08 MED ORDER — FLUTICASONE PROPIONATE 50 MCG/ACT NA SUSP: 2.0000 | Freq: Every day | NASAL | 0 refills | Status: DC

## 2019-07-08 MED ORDER — CETIRIZINE HCL 10 MG PO TABS: 10.0000 mg | ORAL_TABLET | Freq: Every day | ORAL | 0 refills | Status: DC

## 2019-07-08 NOTE — Patient Instructions (Signed)

## 2019-07-08 NOTE — Progress Notes (Signed)
Subjective:  Patient ID: Andrea [name], female    DOB: 02/17/1986  Age: 33 y.o. MRN: [id number]  CC: Ear Pain (ears pain--1 wk---advil)  Otalgia  There is pain in the right ear. This is a new problem. The current episode started in the past 7 days. The problem occurs constantly. The problem has been unchanged. There has been no fever. The pain is moderate. Pertinent negatives include no abdominal pain, coughing, diarrhea, ear discharge, headaches, hearing loss, neck pain, rash, rhinorrhea, sore throat or vomiting. She has tried NSAIDs for the symptoms. The treatment provided no relief. There is no history of a chronic ear infection, hearing loss or a tympanostomy tube.   Reviewed past Medical, Social and Family history today.  Outpatient Medications Prior to Visit  Medication Sig Dispense Refill  . Acetaminophen (TYLENOL EXTRA STRENGTH PO) Take 1,000 mg by mouth every 6 (six) hours as needed (pain/ headache).     . phentermine 30 MG capsule Take 1 capsule (30 mg total) by mouth every morning. 30 capsule 1  . Prenatal Vit-Fe Fumarate-FA (PRENATAL MULTIVITAMIN) TABS Take 1 tablet by mouth at bedtime.    . AMBULATORY NON FORMULARY MEDICATION Medication Name: nitroglycerin 0.125 mg apply a pea size amount rectally three times a day for 6-8 weeks (Patient not taking: Reported on 06/20/2019) 1 Tube 1   No facility-administered medications prior to visit.     ROS See HPI  Objective:  BP 112/72   Pulse 93   Temp 98.6 F (37 C) (Oral)   Ht 4' 10.76" (1.493 m)   Wt 175 lb 12.8 oz (79.7 kg)   SpO2 99%   BMI 35.80 kg/m   BP Readings from Last 3 Encounters:  07/08/19 112/72  06/20/19 110/76  05/20/19 102/64    Wt Readings from Last 3 Encounters:  07/08/19 175 lb 12.8 oz (79.7 kg)  06/20/19 177 lb (80.3 kg)  05/20/19 185 lb 9.6 oz (84.2 kg)    Physical Exam Vitals signs reviewed.  HENT:     Right Ear: Ear canal and external ear normal. A middle ear effusion is present. There is  no impacted cerumen. No mastoid tenderness. Tympanic membrane is not injected or perforated.     Left Ear: Tympanic membrane, ear canal and external ear normal. There is no impacted cerumen. No mastoid tenderness. Tympanic membrane is not injected or perforated.  Neck:     Musculoskeletal: Normal range of motion and neck supple.  Pulmonary:     Effort: Pulmonary effort is normal.  Lymphadenopathy:     Cervical: No cervical adenopathy.  Neurological:     Mental Status: [name] alert and oriented to person, place, and time.    Lab Results  Component Value Date   WBC 4.9 03/18/2019   HGB 13.5 03/18/2019   HCT 40.4 03/18/2019   PLT 265.0 03/18/2019   GLUCOSE 85 08/16/2018   CHOL 234 (H) 08/16/2018   TRIG 102.0 08/16/2018   HDL 50.50 08/16/2018   LDLCALC 163 (H) 08/16/2018   ALT 18 08/16/2018   AST 17 08/16/2018   NA 140 08/16/2018   K 4.4 08/16/2018   CL 105 08/16/2018   CREATININE 0.65 08/16/2018   BUN 16 08/16/2018   CO2 27 08/16/2018   TSH 0.88 03/18/2019    Assessment & Plan:   Kiely was seen today for ear pain.  Diagnoses and all orders for this visit:  Right ear pain -     cetirizine (ZYRTEC) 10 MG tablet; Take  1 tablet (10 mg total) by mouth at bedtime. -     fluticasone (FLONASE) 50 MCG/ACT nasal spray; Place 2 sprays into both nostrils daily.  Eustachian tube dysfunction, right -     cetirizine (ZYRTEC) 10 MG tablet; Take 1 tablet (10 mg total) by mouth at bedtime. -     fluticasone (FLONASE) 50 MCG/ACT nasal spray; Place 2 sprays into both nostrils daily.   I am having Andrea [name] start on cetirizine and fluticasone. I am also having her maintain her prenatal multivitamin, Acetaminophen (TYLENOL EXTRA STRENGTH PO), AMBULATORY NON FORMULARY MEDICATION, and phentermine.  Meds ordered this encounter  Medications  . cetirizine (ZYRTEC) 10 MG tablet    Sig: Take 1 tablet (10 mg total) by mouth at bedtime.    Dispense:  14 tablet    Refill:  0    Order  Specific Question:   Supervising Provider    Answer:   Lucille Passy [[id number]]  . fluticasone (FLONASE) 50 MCG/ACT nasal spray    Sig: Place 2 sprays into both nostrils daily.    Dispense:  16 g    Refill:  0    Order Specific Question:   Supervising Provider    Answer:   Lucille Passy [[id number]]    Problem List Items Addressed This Visit    None    Visit Diagnoses    Right ear pain    -  Primary   Relevant Medications   cetirizine (ZYRTEC) 10 MG tablet   fluticasone (FLONASE) 50 MCG/ACT nasal spray   Eustachian tube dysfunction, right       Relevant Medications   cetirizine (ZYRTEC) 10 MG tablet   fluticasone (FLONASE) 50 MCG/ACT nasal spray       Follow-up: Return if symptoms worsen or fail to improve.  Wilfred Lacy, NP

## 2019-07-14 ENCOUNTER — Ambulatory Visit: Payer: Self-pay | Admitting: *Deleted

## 2019-07-14 ENCOUNTER — Telehealth: Payer: Self-pay | Admitting: Nurse Practitioner

## 2019-07-14 NOTE — Telephone Encounter (Signed)
   Reason for Disposition . Earache  (Exceptions: brief ear pain of < 60 minutes duration, earache occurring during air travel  Answer Assessment - Initial Assessment Questions 1. LOCATION: "Which ear is involved?"     Right ear 2. ONSET: "When did the ear start hurting"      More than 2 weeks  3. SEVERITY: "How bad is the pain?"  (Scale 1-10; mild, moderate or severe)   - MILD (1-3): doesn't interfere with normal activities    - MODERATE (4-7): interferes with normal activities or awakens from sleep    - SEVERE (8-10): excruciating pain, unable to do any normal activities      6- Moderate pain, same as day of appointment. Pain worse at night 4. URI SYMPTOMS: " Do you have a runny nose or cough?"     Denies all  5. FEVER: "Do you have a fever?" If so, ask: "What is your temperature, how was it measured, and when did it start?"     No fever  6. CAUSE: "Have you been swimming recently?", "How often do you use Q-TIPS?", "Have you had any recent air travel or scuba diving?"     No swimming or airplane travel  7. OTHER SYMPTOMS: "Do you have any other symptoms?" (e.g., headache, stiff neck, dizziness, vomiting, runny nose, decreased hearing)     Denies all  8. PREGNANCY: "Is there any chance you are pregnant?" "When was your last menstrual period?"     LMP 07/07/2019  Protocols used: [facility]  Patient states she has been using flonase and taking zyrtec since prescribed last week - 07/08/2019.  States her ear pain is no better- still having moderate pain 6/10 in right ear.  Denies fever, recent swimming, or airplane travel.  States she has been experiencing the right ear pain for over 2 weeks.  Transferred patient's call to PCP's office to schedule follow up as she has not seen any improvement.

## 2019-07-14 NOTE — Telephone Encounter (Signed)
Questions for Screening COVID-19  Symptom onset: n/a  Travel or Contacts:  Went to [place] on 06/05/2019 -06/10/2019.   During this illness, did/does the patient experience any of the following symptoms? Fever >100.64F []   Yes [x]   No []   Unknown Subjective fever (felt feverish) []   Yes [x]   No []   Unknown Chills []   Yes [x]   No []   Unknown Muscle aches (myalgia) []   Yes [x]   No []   Unknown Runny nose (rhinorrhea) []   Yes [x]   No []   Unknown Sore throat []   Yes [x]   No []   Unknown Cough (new onset or worsening of chronic cough) []   Yes [x]   No []   Unknown Shortness of breath (dyspnea) []   Yes [x]   No []   Unknown Nausea or vomiting []   Yes [x]   No []   Unknown Headache []   Yes [x]   No []   Unknown Abdominal pain  []   Yes [x]   No []   Unknown Diarrhea (?3 loose/looser than normal stools/24hr period) []   Yes [x]   No []   Unknown Other, specify:

## 2019-07-15 ENCOUNTER — Ambulatory Visit: Payer: BC Managed Care – PPO | Admitting: Nurse Practitioner

## 2019-07-21 ENCOUNTER — Ambulatory Visit: Payer: BC Managed Care – PPO | Admitting: Nurse Practitioner

## 2019-07-21 ENCOUNTER — Other Ambulatory Visit: Payer: BC Managed Care – PPO

## 2019-07-22 ENCOUNTER — Telehealth: Payer: Self-pay | Admitting: Nurse Practitioner

## 2019-07-22 ENCOUNTER — Ambulatory Visit: Payer: BC Managed Care – PPO | Admitting: Nurse Practitioner

## 2019-07-22 NOTE — Telephone Encounter (Signed)
Copied from [place] [id number]. Topic: Appointment Scheduling - Scheduling Inquiry for Clinic >> Jul 22, 2019  2:02 PM Scherrie Gerlach wrote: Reason for CRM:  pt had to schedule fu for ear issues next week (not better) because no one to keep her kids.  Pt wants to know if Nashayla Telleria thinks she could do a virtual appt for this issue, she would be willing to try >> Jul 22, 2019  2:27 PM Noitamyae, Building surveyor, LPN wrote: Baldo Ash please advise, ok for virtual visit instead of F2F?   Unfortunately it will be difficulty to determine cause of persistent ear discomfort without direct examination. F2F appt prefered

## 2019-07-22 NOTE — Telephone Encounter (Signed)
Please help pt set up an appt.

## 2019-07-23 ENCOUNTER — Ambulatory Visit: Payer: BC Managed Care – PPO | Admitting: Nurse Practitioner

## 2019-07-25 NOTE — Telephone Encounter (Signed)
Please help.

## 2019-07-25 NOTE — Telephone Encounter (Signed)
Ok to schedule on Tuesday if that is her only availabilty

## 2019-07-25 NOTE — Telephone Encounter (Signed)
Pt called and wants to be seen in the office as soon as possible, Since Baldo Ash is off today I offered for he to be seen by another provider but she doesn't want to, she was asking about Tuesday but the only thing available is a Hospital follow up slot and Wednesday there are only two 15 minute slots, are you only doing 30 minutes?

## 2019-07-25 NOTE — Telephone Encounter (Signed)
Done

## 2019-07-29 ENCOUNTER — Ambulatory Visit: Payer: BC Managed Care – PPO | Admitting: Nurse Practitioner

## 2019-07-31 ENCOUNTER — Ambulatory Visit: Payer: BC Managed Care – PPO | Admitting: Nurse Practitioner

## 2019-08-01 ENCOUNTER — Ambulatory Visit: Payer: BC Managed Care – PPO | Admitting: Nurse Practitioner

## 2019-08-22 ENCOUNTER — Ambulatory Visit: Payer: BC Managed Care – PPO | Admitting: Nurse Practitioner

## 2019-08-22 ENCOUNTER — Encounter: Payer: Self-pay | Admitting: Nurse Practitioner

## 2019-08-22 ENCOUNTER — Other Ambulatory Visit: Payer: Self-pay

## 2019-08-22 VITALS — BP 108/80 | HR 101 | Temp 98.1°F | Ht 58.76 in | Wt 173.0 lb

## 2019-08-22 DIAGNOSIS — H9201 Otalgia, right ear: Secondary | ICD-10-CM

## 2019-08-22 DIAGNOSIS — Z13220 Encounter for screening for lipoid disorders: Secondary | ICD-10-CM | POA: Diagnosis not present

## 2019-08-22 DIAGNOSIS — Z7689 Persons encountering health services in other specified circumstances: Secondary | ICD-10-CM | POA: Diagnosis not present

## 2019-08-22 DIAGNOSIS — E782 Mixed hyperlipidemia: Secondary | ICD-10-CM

## 2019-08-22 DIAGNOSIS — H6981 Other specified disorders of Eustachian tube, right ear: Secondary | ICD-10-CM

## 2019-08-22 DIAGNOSIS — Z6835 Body mass index (BMI) 35.0-35.9, adult: Secondary | ICD-10-CM | POA: Diagnosis not present

## 2019-08-22 DIAGNOSIS — Z23 Encounter for immunization: Secondary | ICD-10-CM

## 2019-08-22 DIAGNOSIS — Z136 Encounter for screening for cardiovascular disorders: Secondary | ICD-10-CM | POA: Diagnosis not present

## 2019-08-22 DIAGNOSIS — E6609 Other obesity due to excess calories: Secondary | ICD-10-CM

## 2019-08-22 LAB — TSH: TSH: 0.74 mIU/L

## 2019-08-22 MED ORDER — PHENTERMINE HCL 30 MG PO CAPS: 30.0000 mg | ORAL_CAPSULE | ORAL | 0 refills | Status: DC

## 2019-08-22 NOTE — Progress Notes (Signed)
Subjective:  Patient ID: Andrea [name], female    DOB: September 06, 1986  Age: 33 y.o. MRN: [id number]  CC: Follow-up (follow up ears and weight loss---ear is better and worse--up and down/)  HPI Weight Management: BMI 35.23  unable to maintain regular exercise regimen due to school schedule and home responsibility. Reports she maintain low fat diet with small portions. She has lost 12Lbs in last 1months with phentermine. She has lost 2lbs since last OV 65month ago denies any adverse side effects with phentermine  Wt Readings from Last 3 Encounters:  08/22/19 173 lb (78.5 kg)  07/08/19 175 lb 12.8 oz (79.7 kg)  06/20/19 177 lb (80.3 kg)   BP Readings from Last 3 Encounters:  08/22/19 108/80  07/08/19 112/72  06/20/19 110/76   She also presents with persistent R. ear pain despite use of flonase and zyrtec. She denies any hearing loss, no head injury, no swollen lymph nodes, no sore throat, no ear drainage, no fever, no rash, no dizziness, no nasal congestion.  Reviewed past Medical, Social and Family history today.  Outpatient Medications Prior to Visit  Medication Sig Dispense Refill  . Acetaminophen (TYLENOL EXTRA STRENGTH PO) Take 1,000 mg by mouth every 6 (six) hours as needed (pain/ headache).     . fluticasone (FLONASE) 50 MCG/ACT nasal spray Place 2 sprays into both nostrils daily. 16 g 0  . phentermine 30 MG capsule Take 1 capsule (30 mg total) by mouth every morning. 30 capsule 1  . Prenatal Vit-Fe Fumarate-FA (PRENATAL MULTIVITAMIN) TABS Take 1 tablet by mouth at bedtime.    . AMBULATORY NON FORMULARY MEDICATION Medication Name: nitroglycerin 0.125 mg apply a pea size amount rectally three times a day for 6-8 weeks (Patient not taking: Reported on 06/20/2019) 1 Tube 1  . cetirizine (ZYRTEC) 10 MG tablet Take 1 tablet (10 mg total) by mouth at bedtime. (Patient not taking: Reported on 08/22/2019) 14 tablet 0   No facility-administered medications prior to visit.    ROS See HPI   Objective:  BP 108/80   Pulse (!) 101   Temp 98.1 F (36.7 C) (Tympanic)   Ht 4' 10.76" (1.493 m)   Wt 173 lb (78.5 kg)   LMP 08/21/2019   SpO2 99%   Breastfeeding No   BMI 35.23 kg/m   BP Readings from Last 3 Encounters:  08/22/19 108/80  07/08/19 112/72  06/20/19 110/76   Wt Readings from Last 3 Encounters:  08/22/19 173 lb (78.5 kg)  07/08/19 175 lb 12.8 oz (79.7 kg)  06/20/19 177 lb (80.3 kg)   Physical Exam Vitals signs reviewed.  HENT:     Right Ear: Hearing, ear canal and external ear normal. A middle ear effusion is present. No mastoid tenderness. Tympanic membrane is not injected, perforated or erythematous.     Left Ear: Hearing, ear canal and external ear normal. A middle ear effusion is present. No mastoid tenderness. Tympanic membrane is not injected, perforated or erythematous.  Cardiovascular:     Rate and Rhythm: Normal rate and regular rhythm.     Pulses: Normal pulses.     Heart sounds: Normal heart sounds.  Pulmonary:     Effort: Pulmonary effort is normal.     Breath sounds: Normal breath sounds.  Musculoskeletal:     Right lower leg: No edema.     Left lower leg: No edema.  Neurological:     Mental Status: [name] alert and oriented to person, place, and time.  Psychiatric:  Mood and Affect: Mood normal.        Behavior: Behavior normal.        Thought Content: Thought content normal.    Lab Results  Component Value Date   WBC 4.9 03/18/2019   HGB 13.5 03/18/2019   HCT 40.4 03/18/2019   PLT 265.0 03/18/2019   GLUCOSE 94 08/22/2019   CHOL 228 (H) 08/22/2019   TRIG 141 08/22/2019   HDL 44 (L) 08/22/2019   LDLCALC 157 (H) 08/22/2019   ALT 18 08/16/2018   AST 17 08/16/2018   NA 142 08/22/2019   K 4.6 08/22/2019   CL 104 08/22/2019   CREATININE 0.66 08/22/2019   BUN 10 08/22/2019   CO2 24 08/22/2019   TSH 0.74 08/22/2019    No results found.  Assessment & Plan:   Maureena was seen today for follow-up.  Diagnoses and all  orders for this visit:  Class 2 obesity due to excess calories without serious comorbidity with body mass index (BMI) of 35.0 to 35.9 in adult -     Basic metabolic panel -     Cancel: TSH -     TSH  Eustachian tube dysfunction, right -     Ambulatory referral to ENT -     TSH  Right ear pain -     Ambulatory referral to ENT  Mixed hyperlipidemia -     Lipid panel -     TSH  Encounter for weight management -     phentermine 30 MG capsule; Take 1 capsule (30 mg total) by mouth every morning. -     TSH  Need for immunization against influenza -     Flu Vaccine QUAD 36+ mos IM   I have discontinued Sinthia A. Decelles's AMBULATORY NON FORMULARY MEDICATION and cetirizine. I am also having her maintain her prenatal multivitamin, Acetaminophen (TYLENOL EXTRA STRENGTH PO), fluticasone, and phentermine.  Meds ordered this encounter  Medications  . phentermine 30 MG capsule    Sig: Take 1 capsule (30 mg total) by mouth every morning.    Dispense:  30 capsule    Refill:  0    Order Specific Question:   Supervising Provider    Answer:   MATTHEWS, CODY [[id number]]    Problem List Items Addressed This Visit      Nervous and Auditory   Eustachian tube dysfunction, right   Relevant Orders   Ambulatory referral to ENT   TSH (Completed)     Other   Class 2 obesity without serious comorbidity with body mass index (BMI) of 35.0 to 35.9 in adult - Primary   Relevant Medications   phentermine 30 MG capsule   Other Relevant Orders   Basic metabolic panel (Completed)   TSH (Completed)   Mixed hyperlipidemia   Relevant Orders   Lipid panel (Completed)   TSH (Completed)   Right ear pain   Relevant Orders   Ambulatory referral to ENT    Other Visit Diagnoses    Encounter for weight management       Relevant Medications   phentermine 30 MG capsule   Other Relevant Orders   TSH (Completed)   Need for immunization against influenza       Relevant Orders   Flu Vaccine QUAD 36+ mos IM  (Completed)      Follow-up: Return in about 4 months (around 12/22/2019) for CPE and weight management (fasting).  Alysia Penna, NP

## 2019-08-22 NOTE — Patient Instructions (Addendum)
Go to lab for blood draw.  Continue phentermine for another month, then need to stop for 55months.  Exercising to Lose Weight Exercise is structured, repetitive physical activity to improve fitness and health. Getting regular exercise is important for everyone. It is especially important if you are overweight. Being overweight increases your risk of heart disease, stroke, diabetes, high blood pressure, and several types of cancer. Reducing your calorie intake and exercising can help you lose weight. Exercise is usually categorized as moderate or vigorous intensity. To lose weight, most people need to do a certain amount of moderate-intensity or vigorous-intensity exercise each week. Moderate-intensity exercise  Moderate-intensity exercise is any activity that gets you moving enough to burn at least three times more energy (calories) than if you were sitting. Examples of moderate exercise include:  Walking a mile in 15 minutes.  Doing light yard work.  Biking at an easy pace. Most people should get at least 150 minutes (2 hours and 30 minutes) a week of moderate-intensity exercise to maintain their body weight. Vigorous-intensity exercise Vigorous-intensity exercise is any activity that gets you moving enough to burn at least six times more calories than if you were sitting. When you exercise at this intensity, you should be working hard enough that you are not able to carry on a conversation. Examples of vigorous exercise include:  Running.  Playing a team sport, such as football, basketball, and soccer.  Jumping rope. Most people should get at least 75 minutes (1 hour and 15 minutes) a week of vigorous-intensity exercise to maintain their body weight. How can exercise affect me? When you exercise enough to burn more calories than you eat, you lose weight. Exercise also reduces body fat and builds muscle. The more muscle you have, the more calories you burn. Exercise also:  Improves  mood.  Reduces stress and tension.  Improves your overall fitness, flexibility, and endurance.  Increases bone strength. The amount of exercise you need to lose weight depends on:  Your age.  The type of exercise.  Any health conditions you have.  Your overall physical ability. Talk to your health care provider about how much exercise you need and what types of activities are safe for you. What actions can I take to lose weight? Nutrition   Make changes to your diet as told by your health care provider or diet and nutrition specialist (dietitian). This may include: ? Eating fewer calories. ? Eating more protein. ? Eating less unhealthy fats. ? Eating a diet that includes fresh fruits and vegetables, whole grains, low-fat dairy products, and lean protein. ? Avoiding foods with added fat, salt, and sugar.  Drink plenty of water while you exercise to prevent dehydration or heat stroke. Activity  Choose an activity that you enjoy and set realistic goals. Your health care provider can help you make an exercise plan that works for you.  Exercise at a moderate or vigorous intensity most days of the week. ? The intensity of exercise may vary from person to person. You can tell how intense a workout is for you by paying attention to your breathing and heartbeat. Most people will notice their breathing and heartbeat get faster with more intense exercise.  Do resistance training twice each week, such as: ? Push-ups. ? Sit-ups. ? Lifting weights. ? Using resistance bands.  Getting short amounts of exercise can be just as helpful as long structured periods of exercise. If you have trouble finding time to exercise, try to include exercise in your  daily routine. ? Get up, stretch, and walk around every 30 minutes throughout the day. ? Go for a walk during your lunch break. ? Park your car farther away from your destination. ? If you take public transportation, get off one stop early  and walk the rest of the way. ? Make phone calls while standing up and walking around. ? Take the stairs instead of elevators or escalators.  Wear comfortable clothes and shoes with good support.  Do not exercise so much that you hurt yourself, feel dizzy, or get very short of breath. Where to find more information  U.S. Department of Health and Human Services: BondedCompany.at  Centers for Disease Control and Prevention (CDC): http://www.wolf.info/ Contact a health care provider:  Before starting a new exercise program.  If you have questions or concerns about your weight.  If you have a medical problem that keeps you from exercising. Get help right away if you have any of the following while exercising:  Injury.  Dizziness.  Difficulty breathing or shortness of breath that does not go away when you stop exercising.  Chest pain.  Rapid heartbeat. Summary  Being overweight increases your risk of heart disease, stroke, diabetes, high blood pressure, and several types of cancer.  Losing weight happens when you burn more calories than you eat.  Reducing the amount of calories you eat in addition to getting regular moderate or vigorous exercise each week helps you lose weight. This information is not intended to replace advice given to you by your health care provider. Make sure you discuss any questions you have with your health care provider. Document Released: 12/16/2010 Document Revised: 11/26/2017 Document Reviewed: 11/26/2017 Elsevier Patient Education  2020 [employer].

## 2019-08-23 ENCOUNTER — Encounter: Payer: Self-pay | Admitting: Nurse Practitioner

## 2019-08-23 DIAGNOSIS — E782 Mixed hyperlipidemia: Secondary | ICD-10-CM | POA: Insufficient documentation

## 2019-08-23 DIAGNOSIS — H6993 Unspecified Eustachian tube disorder, bilateral: Secondary | ICD-10-CM | POA: Insufficient documentation

## 2019-08-23 DIAGNOSIS — H9201 Otalgia, right ear: Secondary | ICD-10-CM | POA: Insufficient documentation

## 2019-08-23 DIAGNOSIS — H6981 Other specified disorders of Eustachian tube, right ear: Secondary | ICD-10-CM | POA: Insufficient documentation

## 2019-08-23 LAB — BASIC METABOLIC PANEL
BUN: 10 mg/dL (ref 7–25)
CO2: 24 mmol/L (ref 20–32)
Calcium: 9.6 mg/dL (ref 8.6–10.2)
Chloride: 104 mmol/L (ref 98–110)
Creat: 0.66 mg/dL (ref 0.50–1.10)
Glucose, Bld: 94 mg/dL (ref 65–99)
Potassium: 4.6 mmol/L (ref 3.5–5.3)
Sodium: 142 mmol/L (ref 135–146)

## 2019-08-23 LAB — LIPID PANEL
Cholesterol: 228 mg/dL — ABNORMAL HIGH (ref <200)
HDL: 44 mg/dL — ABNORMAL LOW (ref >=50)
LDL Cholesterol (Calc): 157 mg/dL (calc) — ABNORMAL HIGH
Non-HDL Cholesterol (Calc): 184 mg/dL (calc) — ABNORMAL HIGH (ref <130)
Total CHOL/HDL Ratio: 5.2 (calc) — ABNORMAL HIGH (ref <5.0)
Triglycerides: 141 mg/dL (ref <150)

## 2020-01-14 ENCOUNTER — Encounter: Payer: Self-pay | Admitting: Nurse Practitioner

## 2020-01-14 DIAGNOSIS — H906 Mixed conductive and sensorineural hearing loss, bilateral: Secondary | ICD-10-CM | POA: Diagnosis not present

## 2020-01-14 DIAGNOSIS — H6983 Other specified disorders of Eustachian tube, bilateral: Secondary | ICD-10-CM | POA: Diagnosis not present

## 2020-05-05 DIAGNOSIS — Z20822 Contact with and (suspected) exposure to covid-19: Secondary | ICD-10-CM | POA: Diagnosis not present

## 2020-08-06 ENCOUNTER — Encounter: Payer: Self-pay | Admitting: Nurse Practitioner

## 2020-08-06 ENCOUNTER — Telehealth (INDEPENDENT_AMBULATORY_CARE_PROVIDER_SITE_OTHER): Payer: BC Managed Care – PPO | Admitting: Nurse Practitioner

## 2020-08-06 VITALS — BP 115/81 | HR 89 | Temp 98.0°F | Ht 58.75 in

## 2020-08-06 DIAGNOSIS — J028 Acute pharyngitis due to other specified organisms: Secondary | ICD-10-CM

## 2020-08-06 DIAGNOSIS — R0981 Nasal congestion: Secondary | ICD-10-CM

## 2020-08-06 MED ORDER — AMOXICILLIN 875 MG PO TABS: 875.0000 mg | ORAL_TABLET | Freq: Two times a day (BID) | ORAL | 0 refills | Status: DC

## 2020-08-06 MED ORDER — CHLORPHEN-PE-ACETAMINOPHEN 4-10-325 MG PO TABS: 1.0000 | ORAL_TABLET | Freq: Three times a day (TID) | ORAL | 0 refills | Status: AC | PRN

## 2020-08-06 NOTE — Progress Notes (Signed)
Virtual Visit via Video Note  I connected with@ on 08/06/20 at  2:00 PM EDT by a video enabled telemedicine application and verified that I am speaking with the correct person using two identifiers.  Location: Patient:Home Provider: Office Participants: patient and provider  I discussed the limitations of evaluation and management by telemedicine and the availability of in person appointments. I also discussed with the patient that there may be a patient responsible charge related to this service. The patient expressed understanding and agreed to proceed.  CC:Pt c/o body aches, sore throat, and headaches x1 week. Pt tried otc medication(tylenol and advil) with no relief. Denies fever or chills.   History of Present Illness: URI  This is a new problem. The current episode started in the past 7 days. The problem has been gradually worsening. There has been no fever. Associated symptoms include congestion, ear pain, headaches, a plugged ear sensation, rhinorrhea, sinus pain, sneezing, a sore throat and swollen glands. Pertinent negatives include no wheezing. She has tried acetaminophen and NSAIDs for the symptoms.  COVID test scheduled for 08/07/2020. Received COVID vaccine-Janssen 04/2020  Observations/Objective: Physical Exam HENT:     Mouth/Throat:     Pharynx: Oropharyngeal exudate and posterior oropharyngeal erythema present.  Pulmonary:     Effort: Pulmonary effort is normal.  Skin:    Findings: No rash.  Neurological:     Mental Status: [name] alert and oriented to person, place, and time.    Assessment and Plan: Neriah was seen today for acute visit.  Diagnoses and all orders for this visit:  Acute pharyngitis due to other specified organisms -     amoxicillin (AMOXIL) 875 MG tablet; Take 1 tablet (875 mg total) by mouth 2 (two) times daily.  Nasal sinus congestion -     Chlorphen-PE-Acetaminophen 4-10-325 MG TABS; Take 1 tablet by mouth every 8 (eight) hours as needed for  up to 3 days.   Follow Up Instructions: URI Instructions: Encourage adequate oral hydration. Use" Delsym" or" Robitussin" cough syrup varietis for cough.  You can use plain "Tylenol" or "Advil" for fever, chills and achyness.  I discussed the assessment and treatment plan with the patient. The patient was provided an opportunity to ask questions and all were answered. The patient agreed with the plan and demonstrated an understanding of the instructions.   The patient was advised to call back or seek an in-person evaluation if the symptoms worsen or if the condition fails to improve as anticipated.  Alysia Penna, NP

## 2020-08-06 NOTE — Patient Instructions (Signed)
Person Under Monitoring Name: Andrea [name]  Location: [address] [place] [address]   Infection Prevention Recommendations for Individuals Confirmed to have, or Being Evaluated for, 2019 Novel Coronavirus (COVID-19) Infection Who Receive Care at Home  Individuals who are confirmed to have, or are being evaluated for, COVID-19 should follow the prevention steps below until a healthcare provider or local or state health department says they can return to normal activities.  Stay home except to get medical care You should restrict activities outside your home, except for getting medical care. Do not go to work, school, or public areas, and do not use public transportation or taxis.  Call ahead before visiting your doctor Before your medical appointment, call the healthcare provider and tell them that you have, or are being evaluated for, COVID-19 infection. This will help the healthcare providers office take steps to keep other people from getting infected. Ask your healthcare provider to call the local or state health department.  Monitor your symptoms Seek prompt medical attention if your illness is worsening (e.g., difficulty breathing). Before going to your medical appointment, call the healthcare provider and tell them that you have, or are being evaluated for, COVID-19 infection. Ask your healthcare provider to call the local or state health department.  Wear a facemask You should wear a facemask that covers your nose and mouth when you are in the same room with other people and when you visit a healthcare provider. People who live with or visit you should also wear a facemask while they are in the same room with you.  Separate yourself from other people in your home As much as possible, you should stay in a different room from other people in your home. Also, you should use a separate bathroom, if available.  Avoid sharing household items You should not  share dishes, drinking glasses, cups, eating utensils, towels, bedding, or other items with other people in your home. After using these items, you should wash them thoroughly with soap and water.  Cover your coughs and sneezes Cover your mouth and nose with a tissue when you cough or sneeze, or you can cough or sneeze into your sleeve. Throw used tissues in a lined trash can, and immediately wash your hands with soap and water for at least 20 seconds or use an alcohol-based hand rub.  Wash your [employer] your hands often and thoroughly with soap and water for at least 20 seconds. You can use an alcohol-based hand sanitizer if soap and water are not available and if your hands are not visibly dirty. Avoid touching your eyes, nose, and mouth with unwashed hands.   Prevention Steps for Caregivers and Household Members of Individuals Confirmed to have, or Being Evaluated for, COVID-19 Infection Being Cared for in the Home  If you live with, or provide care at home for, a person confirmed to have, or being evaluated for, COVID-19 infection please follow these guidelines to prevent infection:  Follow healthcare providers instructions Make sure that you understand and can help the patient follow any healthcare provider instructions for all care.  Provide for the patients basic needs You should help the patient with basic needs in the home and provide support for getting groceries, prescriptions, and other personal needs.  Monitor the patients symptoms If they are getting sicker, call his or her medical provider and tell them that the patient has, or is being evaluated for, COVID-19 infection. This will help the healthcare providers office  take steps to keep other people from getting infected. Ask the healthcare provider to call the local or state health department.  Limit the number of people who have contact with the patient  If possible, have only one caregiver for the  patient.  Other household members should stay in another home or place of residence. If this is not possible, they should stay  in another room, or be separated from the patient as much as possible. Use a separate bathroom, if available.  Restrict visitors who do not have an essential need to be in the home.  Keep older adults, very young children, and other sick people away from the patient Keep older adults, very young children, and those who have compromised immune systems or chronic health conditions away from the patient. This includes people with chronic heart, lung, or kidney conditions, diabetes, and cancer.  Ensure good ventilation Make sure that shared spaces in the home have good air flow, such as from an air conditioner or an opened window, weather permitting.  Wash your hands often  Wash your hands often and thoroughly with soap and water for at least 20 seconds. You can use an alcohol based hand sanitizer if soap and water are not available and if your hands are not visibly dirty.  Avoid touching your eyes, nose, and mouth with unwashed hands.  Use disposable paper towels to dry your hands. If not available, use dedicated cloth towels and replace them when they become wet.  Wear a facemask and gloves  Wear a disposable facemask at all times in the room and gloves when you touch or have contact with the patients blood, body fluids, and/or secretions or excretions, such as sweat, saliva, sputum, nasal mucus, vomit, urine, or feces.  Ensure the mask fits over your nose and mouth tightly, and do not touch it during use.  Throw out disposable facemasks and gloves after using them. Do not reuse.  Wash your hands immediately after removing your facemask and gloves.  If your personal clothing becomes contaminated, carefully remove clothing and launder. Wash your hands after handling contaminated clothing.  Place all used disposable facemasks, gloves, and other waste in a lined  container before disposing them with other household waste.  Remove gloves and wash your hands immediately after handling these items.  Do not share dishes, glasses, or other household items with the patient  Avoid sharing household items. You should not share dishes, drinking glasses, cups, eating utensils, towels, bedding, or other items with a patient who is confirmed to have, or being evaluated for, COVID-19 infection.  After the person uses these items, you should wash them thoroughly with soap and water.  Wash laundry thoroughly  Immediately remove and wash clothes or bedding that have blood, body fluids, and/or secretions or excretions, such as sweat, saliva, sputum, nasal mucus, vomit, urine, or feces, on them.  Wear gloves when handling laundry from the patient.  Read and follow directions on labels of laundry or clothing items and detergent. In general, wash and dry with the warmest temperatures recommended on the label.  Clean all areas the individual has used often  Clean all touchable surfaces, such as counters, tabletops, doorknobs, bathroom fixtures, toilets, phones, keyboards, tablets, and bedside tables, every day. Also, clean any surfaces that may have blood, body fluids, and/or secretions or excretions on them.  Wear gloves when cleaning surfaces the patient has come in contact with.  Use a diluted bleach solution (e.g., dilute bleach with 1 part  bleach and 10 parts water) or a household disinfectant with a label that says EPA-registered for coronaviruses. To make a bleach solution at home, add 1 tablespoon of bleach to 1 quart (4 cups) of water. For a larger supply, add  cup of bleach to 1 gallon (16 cups) of water.  Read labels of cleaning products and follow recommendations provided on product labels. Labels contain instructions for safe and effective use of the cleaning product including precautions you should take when applying the product, such as wearing gloves or  eye protection and making sure you have good ventilation during use of the product.  Remove gloves and wash hands immediately after cleaning.  Monitor yourself for signs and symptoms of illness Caregivers and household members are considered close contacts, should monitor their health, and will be asked to limit movement outside of the home to the extent possible. Follow the monitoring steps for close contacts listed on the symptom monitoring form.   ? If you have additional questions, contact your local health department or call the epidemiologist on call at [phone] (available 24/7). ? This guidance is subject to change. For the most up-to-date guidance from [facility], please refer to their website: YouBlogs.pl

## 2020-08-10 ENCOUNTER — Encounter: Payer: Self-pay | Admitting: Nurse Practitioner

## 2020-11-01 ENCOUNTER — Ambulatory Visit (INDEPENDENT_AMBULATORY_CARE_PROVIDER_SITE_OTHER): Payer: BC Managed Care – PPO | Admitting: Nurse Practitioner

## 2020-11-01 ENCOUNTER — Encounter: Payer: Self-pay | Admitting: Nurse Practitioner

## 2020-11-01 ENCOUNTER — Other Ambulatory Visit: Payer: Self-pay

## 2020-11-01 VITALS — BP 110/80 | HR 67 | Temp 97.5°F | Ht <= 58 in | Wt 188.0 lb

## 2020-11-01 DIAGNOSIS — E782 Mixed hyperlipidemia: Secondary | ICD-10-CM

## 2020-11-01 DIAGNOSIS — Z23 Encounter for immunization: Secondary | ICD-10-CM

## 2020-11-01 DIAGNOSIS — Z0001 Encounter for general adult medical examination with abnormal findings: Secondary | ICD-10-CM | POA: Diagnosis not present

## 2020-11-01 DIAGNOSIS — N926 Irregular menstruation, unspecified: Secondary | ICD-10-CM

## 2020-11-01 DIAGNOSIS — E559 Vitamin D deficiency, unspecified: Secondary | ICD-10-CM

## 2020-11-01 DIAGNOSIS — Z6841 Body Mass Index (BMI) 40.0 and over, adult: Secondary | ICD-10-CM | POA: Diagnosis not present

## 2020-11-01 LAB — COMPREHENSIVE METABOLIC PANEL
ALT: 22 U/L (ref 0–35)
AST: 21 U/L (ref 0–37)
Albumin: 4.1 g/dL (ref 3.5–5.2)
Alkaline Phosphatase: 54 U/L (ref 39–117)
BUN: 9 mg/dL (ref 6–23)
CO2: 28 mEq/L (ref 19–32)
Calcium: 9.6 mg/dL (ref 8.4–10.5)
Chloride: 106 mEq/L (ref 96–112)
Creatinine, Ser: 0.66 mg/dL (ref 0.40–1.20)
GFR: 114.98 mL/min (ref >60.00)
Glucose, Bld: 86 mg/dL (ref 70–99)
Potassium: 5 mEq/L (ref 3.5–5.1)
Sodium: 141 mEq/L (ref 135–145)
Total Bilirubin: 0.4 mg/dL (ref 0.2–1.2)
Total Protein: 7.4 g/dL (ref 6.0–8.3)

## 2020-11-01 LAB — LIPID PANEL
Cholesterol: 210 mg/dL — ABNORMAL HIGH (ref 0–200)
HDL: 46.7 mg/dL (ref >39.00)
LDL Cholesterol: 131 mg/dL — ABNORMAL HIGH (ref 0–99)
NonHDL: 163.02
Total CHOL/HDL Ratio: 4
Triglycerides: 158 mg/dL — ABNORMAL HIGH (ref 0.0–149.0)
VLDL: 31.6 mg/dL (ref 0.0–40.0)

## 2020-11-01 LAB — TSH: TSH: 1 u[IU]/mL (ref 0.35–4.50)

## 2020-11-01 LAB — CBC
HCT: 42 % (ref 36.0–46.0)
Hemoglobin: 13.8 g/dL (ref 12.0–15.0)
MCHC: 32.8 g/dL (ref 30.0–36.0)
MCV: 84 fl (ref 78.0–100.0)
Platelets: 215 10*3/uL (ref 150.0–400.0)
RBC: 5.01 Mil/uL (ref 3.87–5.11)
RDW: 14.8 % (ref 11.5–15.5)
WBC: 4.3 10*3/uL (ref 4.0–10.5)

## 2020-11-01 LAB — HEMOGLOBIN A1C: Hgb A1c MFr Bld: 5.4 % (ref 4.6–6.5)

## 2020-11-01 NOTE — Assessment & Plan Note (Addendum)
Had a cycle on 05/24/2020 x 1week and 07/24/20 x1week Home pregnancy tests twice in last 81month: both negative. She has a yrs old son and 37yrs old daughter. Has a miscarriage 02/2019. Use pull-out method for contraception at this time. Labs completed previously: normal FSH, and estrogen.  Repeat TSH. Obtain pelvic US to eval for PCOS. She declined use of any hormonal contraception at this time

## 2020-11-01 NOTE — Patient Instructions (Signed)
Go to lab for blood draw  You will be contacted to schedule an appt for pelvic US.   Exercising to Lose Weight Exercise is structured, repetitive physical activity to improve fitness and health. Getting regular exercise is important for everyone. It is especially important if you are overweight. Being overweight increases your risk of heart disease, stroke, diabetes, high blood pressure, and several types of cancer. Reducing your calorie intake and exercising can help you lose weight. Exercise is usually categorized as moderate or vigorous intensity. To lose weight, most people need to do a certain amount of moderate-intensity or vigorous-intensity exercise each week. Moderate-intensity exercise  Moderate-intensity exercise is any activity that gets you moving enough to burn at least three times more energy (calories) than if you were sitting. Examples of moderate exercise include:  Walking a mile in 15 minutes.  Doing light yard work.  Biking at an easy pace. Most people should get at least 150 minutes (2 hours and 30 minutes) a week of moderate-intensity exercise to maintain their body weight. Vigorous-intensity exercise Vigorous-intensity exercise is any activity that gets you moving enough to burn at least six times more calories than if you were sitting. When you exercise at this intensity, you should be working hard enough that you are not able to carry on a conversation. Examples of vigorous exercise include:  Running.  Playing a team sport, such as football, basketball, and soccer.  Jumping rope. Most people should get at least 75 minutes (1 hour and 15 minutes) a week of vigorous-intensity exercise to maintain their body weight. How can exercise affect me? When you exercise enough to burn more calories than you eat, you lose weight. Exercise also reduces body fat and builds muscle. The more muscle you have, the more calories you burn. Exercise also:  Improves mood.  Reduces  stress and tension.  Improves your overall fitness, flexibility, and endurance.  Increases bone strength. The amount of exercise you need to lose weight depends on:  Your age.  The type of exercise.  Any health conditions you have.  Your overall physical ability. Talk to your health care provider about how much exercise you need and what types of activities are safe for you. What actions can I take to lose weight? Nutrition   Make changes to your diet as told by your health care provider or diet and nutrition specialist (dietitian). This may include: ? Eating fewer calories. ? Eating more protein. ? Eating less unhealthy fats. ? Eating a diet that includes fresh fruits and vegetables, whole grains, low-fat dairy products, and lean protein. ? Avoiding foods with added fat, salt, and sugar.  Drink plenty of water while you exercise to prevent dehydration or heat stroke. Activity  Choose an activity that you enjoy and set realistic goals. Your health care provider can help you make an exercise plan that works for you.  Exercise at a moderate or vigorous intensity most days of the week. ? The intensity of exercise may vary from person to person. You can tell how intense a workout is for you by paying attention to your breathing and heartbeat. Most people will notice their breathing and heartbeat get faster with more intense exercise.  Do resistance training twice each week, such as: ? Push-ups. ? Sit-ups. ? Lifting weights. ? Using resistance bands.  Getting short amounts of exercise can be just as helpful as long structured periods of exercise. If you have trouble finding time to exercise, try to include exercise in  your daily routine. ? Get up, stretch, and walk around every 30 minutes throughout the day. ? Go for a walk during your lunch break. ? Park your car farther away from your destination. ? If you take public transportation, get off one stop early and walk the rest  of the way. ? Make phone calls while standing up and walking around. ? Take the stairs instead of elevators or escalators.  Wear comfortable clothes and shoes with good support.  Do not exercise so much that you hurt yourself, feel dizzy, or get very short of breath. Where to find more information  U.S. Department of Health and Human Services: ThisPath.fi  Centers for Disease Control and Prevention (CDC): FootballExhibition.com.br Contact a health care provider:  Before starting a new exercise program.  If you have questions or concerns about your weight.  If you have a medical problem that keeps you from exercising. Get help right away if you have any of the following while exercising:  Injury.  Dizziness.  Difficulty breathing or shortness of breath that does not go away when you stop exercising.  Chest pain.  Rapid heartbeat. Summary  Being overweight increases your risk of heart disease, stroke, diabetes, high blood pressure, and several types of cancer.  Losing weight happens when you burn more calories than you eat.  Reducing the amount of calories you eat in addition to getting regular moderate or vigorous exercise each week helps you lose weight. This information is not intended to replace advice given to you by your health care provider. Make sure you discuss any questions you have with your health care provider. Document Revised: 11/26/2017 Document Reviewed: 11/26/2017 Elsevier Patient Education  2020 [employer].

## 2020-11-01 NOTE — Progress Notes (Signed)
Subjective:    Patient ID: Andrea [name], female    DOB: 07/07/1986, 34 y.o.   MRN: [id number]  Patient presents today for CPE and eval of chronic conditions  HPI Irregular periods/menstrual cycles Had a cycle on 05/24/2020 x 1week and [facility] Home pregnancy tests twice in last 35month: both negative. She has a yrs old son and 3041yrs old daughter. Has a miscarriage 02/2019. Use pull-out method for contraception at this time. Labs completed previously: normal FSH, and estrogen.  Repeat TSH. Obtain pelvic US to eval for PCOS. She declined use of any hormonal contraception at this time   Class 3 severe obesity with serious comorbidity and body mass index (BMI) of 45.0 to 49.9 in adult [facility](HCC) Advised about the importance of weight loss through daily exercise and heart healthy diet and avoid skipping meals. Provided printed information on diet and exercise. Informed about use of video exercise if unable to engage in outdoor activities. She declined referral to weight management clinic  Sexual History (orientation,birth control, marital status, STD):married, sexually active, denies need for STD screen, up to date with Pelvic exam. Needs breast exam today.  Depression/Suicide: Depression screen [facility] 2/9 11/01/2020 05/20/2019 03/26/2019 03/17/2019 08/16/2018 01/21/2018 01/14/2018  Decreased Interest 0 0 0 0 0 0 0  Down, Depressed, Hopeless 0 0 0 0 0 0 0  PHQ - 2 Score 0 0 0 0 0 0 0  Altered sleeping 0 0 0 - - 1 0  Tired, decreased energy 1 1 0 - - 3 0  Change in appetite 0 0 0 - - 2 0  Feeling bad or failure about yourself  0 0 0 - - 0 0  Trouble concentrating 0 1 0 - - 1 0  Moving slowly or fidgety/restless 0 0 0 - - 1 0  Suicidal thoughts 0 0 0 - - 0 0  PHQ-9 Score 1 2 0 - - 8 0  Difficult doing work/chores Not difficult at all - - - - - -   Vision:will schedule  Dental:will schedule  Immunizations: (TDAP, Hep C screen, Pneumovax, Influenza, zoster)  Health Maintenance  Topic  Date Due  .  Hepatitis C: One time screening is recommended by Center for Disease Control  (CDC) for  adults born from [birth year] through 1965.   11/01/2021*  . Pap Smear  08/16/2021  . Tetanus Vaccine  10/31/2027  . Flu Shot  Completed  . COVID-19 Vaccine  Completed  . HIV Screening  Completed  *Topic was postponed. The date shown is not the original due date.   Diet: Weight:  Wt Readings from Last 3 Encounters:  11/01/20 188 lb (85.3 kg)  08/22/19 173 lb (78.5 kg)  07/08/19 175 lb 12.8 oz (79.7 kg)    Fall Risk: Fall Risk  11/01/2020 03/17/2019 08/16/2018  Falls in the past year? 0 0 No  Number falls in past yr: 0 - -  Injury with Fall? 0 - -   Medications and allergies reviewed with patient and updated if appropriate.  Patient Active Problem List   Diagnosis Date Noted  . Mixed hyperlipidemia 08/23/2019  . Eustachian tube dysfunction, right 08/23/2019  . Class 3 severe obesity with serious comorbidity and body mass index (BMI) of 45.0 to 49.9 in adult [facility](HCC) 03/26/2019  . Seasonal allergic rhinitis 08/16/2018  . Constipation 08/16/2018  . External hemorrhoids without complication 08/16/2018  . Irregular periods/menstrual cycles 08/16/2018  . Hydrosalpinx 05/21/2017    Current Outpatient Medications on File Prior  to Visit  Medication Sig Dispense Refill  . amoxicillin (AMOXIL) 875 MG tablet Take 1 tablet (875 mg total) by mouth 2 (two) times daily. (Patient not taking: Reported on 11/01/2020) 14 tablet 0  . fluticasone (FLONASE) 50 MCG/ACT nasal spray Place 2 sprays into both nostrils daily. (Patient not taking: Reported on 11/01/2020) 16 g 0  . Prenatal Vit-Fe Fumarate-FA (PRENATAL MULTIVITAMIN) TABS Take 1 tablet by mouth at bedtime. (Patient not taking: Reported on 11/01/2020)     No current facility-administered medications on file prior to visit.    Past Medical History:  Diagnosis Date  . Headache(784.0)   . Infertility, anovulation 05/21/2017  . Ovarian mass, right  05/21/2017  . Uterine contractions during pregnancy 01/24/2018  . Uterine size date discrepancy pregnancy, second trimester 10/30/2017    Past Surgical History:  Procedure Laterality Date  . NO PAST SURGERIES      Social History   Socioeconomic History  . Marital status: Married    Spouse name: Not on file  . Number of children: 2  . Years of education: Not on file  . Highest education level: Not on file  Occupational History    Comment: housewife  Tobacco Use  . Smoking status: Never Smoker  . Smokeless tobacco: Never Used  Vaping Use  . Vaping Use: Never used  Substance and Sexual Activity  . Alcohol use: No  . Drug use: No  . Sexual activity: Yes    Birth control/protection: Condom  Other Topics Concern  . Not on file  Social History Narrative  . Not on file   Social Determinants of Health   Financial Resource Strain:   . Difficulty of Paying Living Expenses: Not on file  Food Insecurity:   . Worried About Programme researcher, broadcasting/film/video in the Last Year: Not on file  . Ran Out of Food in the Last Year: Not on file  Transportation Needs:   . Lack of Transportation (Medical): Not on file  . Lack of Transportation (Non-Medical): Not on file  Physical Activity:   . Days of Exercise per Week: Not on file  . Minutes of Exercise per Session: Not on file  Stress:   . Feeling of Stress : Not on file  Social Connections:   . Frequency of Communication with Friends and Family: Not on file  . Frequency of Social Gatherings with Friends and Family: Not on file  . Attends Religious Services: Not on file  . Active Member of Clubs or Organizations: Not on file  . Attends Banker Meetings: Not on file  . Marital Status: Not on file   Family History  Problem Relation Age of Onset  . Hepatitis C Mother        Review of Systems  Constitutional: Negative for fever, malaise/fatigue and weight loss.  HENT: Negative for congestion and sore throat.   Eyes:       Negative  for visual changes  Respiratory: Negative for cough and shortness of breath.   Cardiovascular: Negative for chest pain, palpitations and leg swelling.  Gastrointestinal: Negative for blood in stool, constipation, diarrhea and heartburn.  Genitourinary: Negative for dysuria, frequency and urgency.  Musculoskeletal: Negative for falls, joint pain and myalgias.  Skin: Negative for rash.  Neurological: Negative for dizziness, sensory change and headaches.  Endo/Heme/Allergies: Does not bruise/bleed easily.  Psychiatric/Behavioral: Negative for depression, substance abuse and suicidal ideas. The patient is not nervous/anxious.     Objective:   Vitals:   11/01/20 1144  BP: 110/80  Pulse: 67  Temp: (!) 97.5 F (36.4 C)  SpO2: 97%   Body mass index is 47.06 kg/m.  Physical Examination:  Physical Exam Vitals reviewed.  Constitutional:      General: [name] not in acute distress.    Appearance: [name] well-developed. [name] obese.  HENT:     Right Ear: Tympanic membrane, ear canal and external ear normal.     Left Ear: Tympanic membrane, ear canal and external ear normal.  Eyes:     Extraocular Movements: Extraocular movements intact.     Conjunctiva/sclera: Conjunctivae normal.  Cardiovascular:     Rate and Rhythm: Normal rate and regular rhythm.     Pulses: Normal pulses.     Heart sounds: Normal heart sounds.  Pulmonary:     Effort: Pulmonary effort is normal. No respiratory distress.     Breath sounds: Normal breath sounds.  Chest:     Chest wall: No tenderness.     Breasts:        Right: Normal.        Left: Normal.  Abdominal:     General: Bowel sounds are normal.     Palpations: Abdomen is soft.  Musculoskeletal:        General: Normal range of motion.     Right lower leg: No edema.     Left lower leg: No edema.  Lymphadenopathy:     Upper Body:     Right upper body: No supraclavicular, axillary or pectoral adenopathy.     Left upper body: No supraclavicular,  axillary or pectoral adenopathy.  Skin:    General: Skin is warm and dry.  Neurological:     Mental Status: [name] alert and oriented to person, place, and time.     Deep Tendon Reflexes: Reflexes are normal and symmetric.  Psychiatric:        Mood and Affect: Mood normal.        Behavior: Behavior normal.        Thought Content: Thought content normal.    ASSESSMENT and PLAN: This visit occurred during the SARS-CoV-2 public health emergency.  Safety protocols were in place, including screening questions prior to the visit, additional usage of staff PPE, and extensive cleaning of exam room while observing appropriate contact time as indicated for disinfecting solutions.   Andrea [name] was seen today for annual exam.  Diagnoses and all orders for this visit:  Encounter for preventative adult health care exam with abnormal findings -     CBC -     Comprehensive metabolic panel  Influenza vaccine needed -     Flu Vaccine QUAD 6+ mos PF IM (Fluarix Quad PF)  Class 3 severe obesity with serious comorbidity and body mass index (BMI) of 45.0 to 49.9 in adult, unspecified obesity type (HCC) -     Hemoglobin A1c -     TSH  Vitamin D insufficiency -     Vitamin D 1,25 dihydroxy  Mixed hyperlipidemia -     Lipid panel  Irregular periods/menstrual cycles -     US Pelvic Complete With Transvaginal; Future -     TSH        Problem List Items Addressed This Visit      Other   Class 3 severe obesity with serious comorbidity and body mass index (BMI) of 45.0 to 49.9 in adult [facility])    Advised about the importance of weight loss through daily exercise and heart healthy diet and  avoid skipping meals. Provided printed information on diet and exercise. Informed about use of video exercise if unable to engage in outdoor activities. She declined referral to weight management clinic      Relevant Orders   Hemoglobin A1c (Completed)   TSH (Completed)   Irregular periods/menstrual cycles     Had a cycle on 05/24/2020 x 1week and 07/24/20 x1week Home pregnancy tests twice in last 24month: both negative. She has a yrs old son and 37yrs old daughter. Has a miscarriage 02/2019. Use pull-out method for contraception at this time. Labs completed previously: normal FSH, and estrogen.  Repeat TSH. Obtain pelvic US to eval for PCOS. She declined use of any hormonal contraception at this time       Relevant Orders   US Pelvic Complete With Transvaginal   TSH (Completed)   Mixed hyperlipidemia   Relevant Orders   Lipid panel (Completed)    Other Visit Diagnoses    Encounter for preventative adult health care exam with abnormal findings    -  Primary   Relevant Orders   CBC (Completed)   Comprehensive metabolic panel (Completed)   Influenza vaccine needed       Relevant Orders   Flu Vaccine QUAD 6+ mos PF IM (Fluarix Quad PF) (Completed)   Vitamin D insufficiency       Relevant Orders   Vitamin D 1,25 dihydroxy      Follow up: Return in about 3 months (around 01/30/2021) for Obesity ( ).  Alysia Penna, NP

## 2020-11-03 ENCOUNTER — Encounter: Payer: Self-pay | Admitting: Nurse Practitioner

## 2020-11-03 NOTE — Assessment & Plan Note (Addendum)
Advised about the importance of weight loss through daily exercise and heart healthy diet and avoid skipping meals. Provided printed information on diet and exercise. Informed about use of video exercise if unable to engage in outdoor activities. She declined referral to weight management clinic

## 2020-11-04 LAB — VITAMIN D 1,25 DIHYDROXY
Vitamin D 1, 25 (OH)2 Total: 57 pg/mL (ref 18–72)
Vitamin D2 1, 25 (OH)2: <8 pg/mL
Vitamin D3 1, 25 (OH)2: 57 pg/mL

## 2020-12-01 ENCOUNTER — Other Ambulatory Visit: Payer: BC Managed Care – PPO

## 2020-12-08 ENCOUNTER — Other Ambulatory Visit: Payer: BC Managed Care – PPO

## 2020-12-08 ENCOUNTER — Other Ambulatory Visit: Payer: Self-pay

## 2020-12-08 DIAGNOSIS — Z20822 Contact with and (suspected) exposure to covid-19: Secondary | ICD-10-CM | POA: Diagnosis not present

## 2020-12-10 LAB — NOVEL CORONAVIRUS, NAA: SARS-CoV-2, NAA: DETECTED — AB

## 2020-12-10 LAB — SARS-COV-2, NAA 2 DAY TAT

## 2020-12-14 ENCOUNTER — Other Ambulatory Visit: Payer: BC Managed Care – PPO

## 2020-12-27 ENCOUNTER — Ambulatory Visit
Admission: RE | Admit: 2020-12-27 | Discharge: 2020-12-27 | Disposition: A | Discharge status: Home / Self Care | Payer: BC Managed Care – PPO | Source: Ambulatory Visit | Attending: Nurse Practitioner | Admitting: Nurse Practitioner

## 2020-12-27 DIAGNOSIS — N926 Irregular menstruation, unspecified: Secondary | ICD-10-CM | POA: Diagnosis not present

## 2020-12-28 ENCOUNTER — Encounter: Payer: Self-pay | Admitting: Nurse Practitioner

## 2020-12-31 ENCOUNTER — Other Ambulatory Visit: Payer: Self-pay | Admitting: Nurse Practitioner

## 2020-12-31 DIAGNOSIS — N926 Irregular menstruation, unspecified: Secondary | ICD-10-CM

## 2020-12-31 NOTE — Progress Notes (Signed)
Ref gyn

## 2021-05-05 ENCOUNTER — Encounter: Payer: Self-pay | Admitting: Nurse Practitioner

## 2021-07-10 ENCOUNTER — Encounter: Payer: Self-pay | Admitting: Nurse Practitioner

## 2021-09-01 IMAGING — US US PELVIS COMPLETE WITH TRANSVAGINAL
1 series · 14 of 25 positions shown · non-contrast
Comparison: 12/09/2010

CLINICAL DATA: Irregular menses, evaluate for polycystic ovarian
syndrome; LMP 11/27/2020; did not have menstrual periods in [REDACTED],
Prencess or Tuesday October, 2020

EXAM:
TRANSABDOMINAL AND TRANSVAGINAL ULTRASOUND OF PELVIS
TECHNIQUE: Both transabdominal and transvaginal ultrasound examinations of the
pelvis were performed. Transabdominal technique was performed for
global imaging of the pelvis including uterus, ovaries, adnexal
regions, and pelvic cul-de-sac. It was necessary to proceed with
endovaginal exam following the transabdominal exam to visualize the
endometrium and adnexa.

[Series 1: us pelvis complete with transvaginal · 0.25mm/px · 14 of 66 slices shown]
[im 1/66]
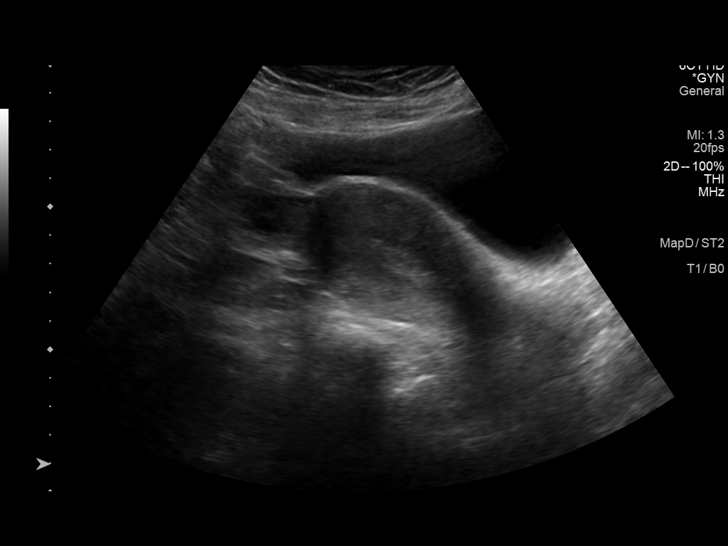
[im 6/66]
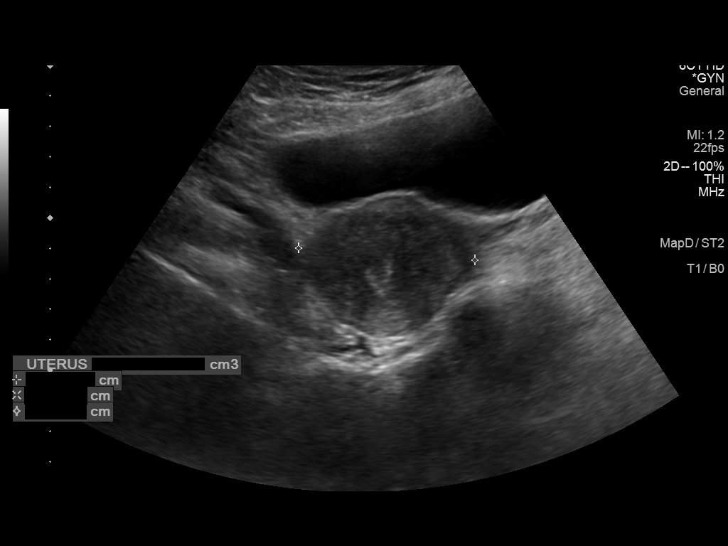
[im 11/66]
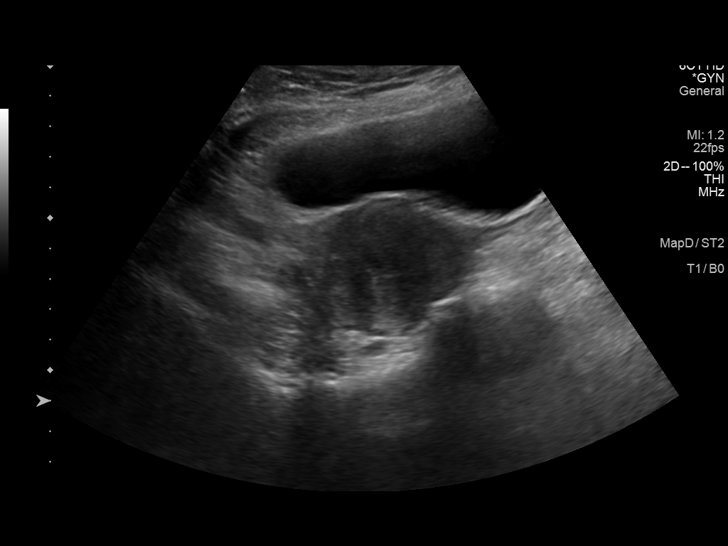
[im 17/66]
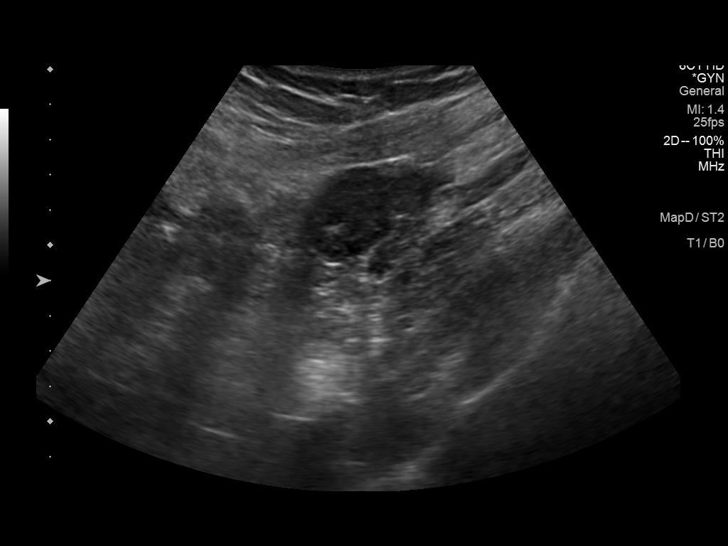
[im 22/66]
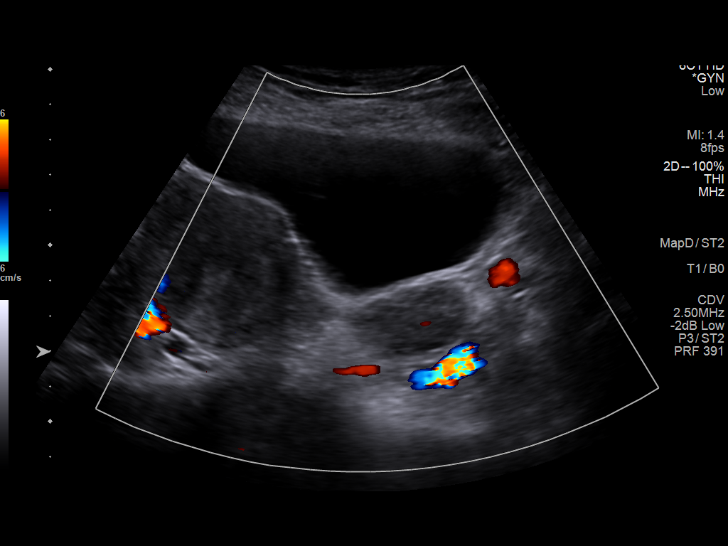
[im 25/66]
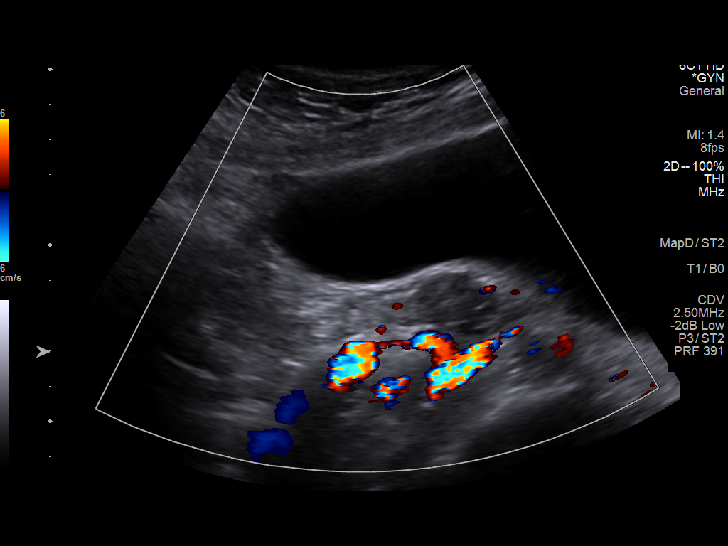
[im 30/66]
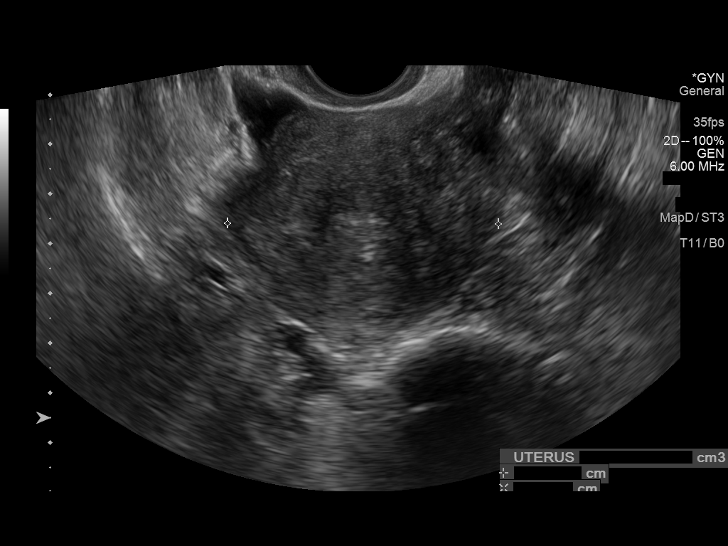
[im 36/66]
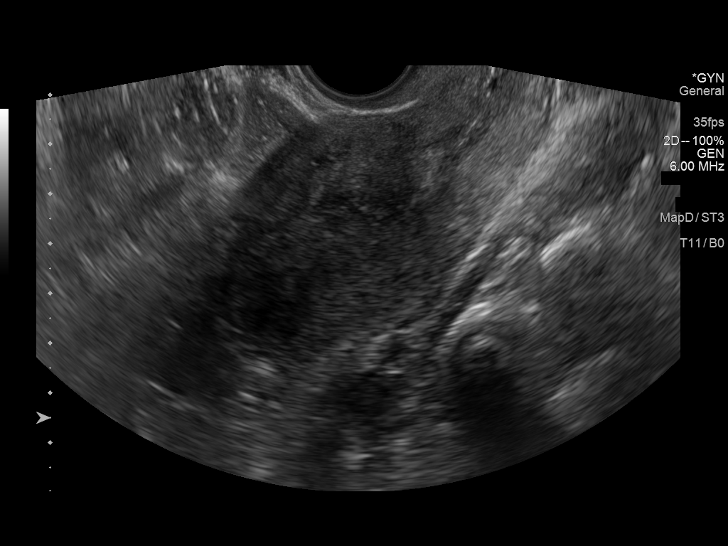
[im 41/66]
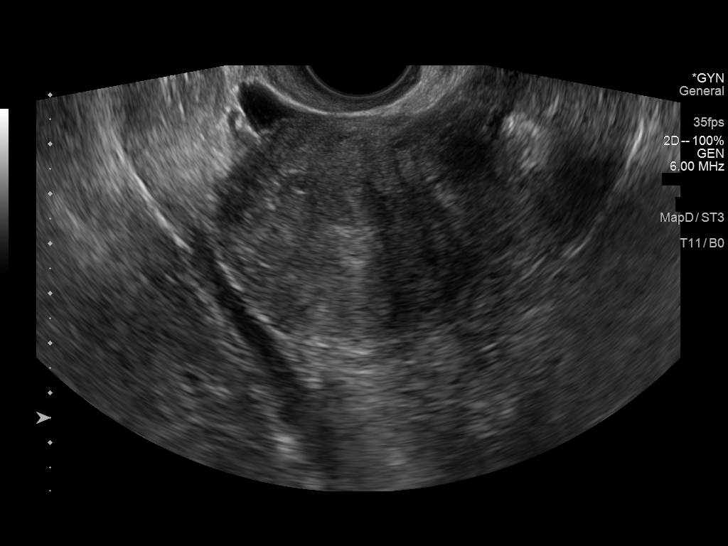
[im 44/66]
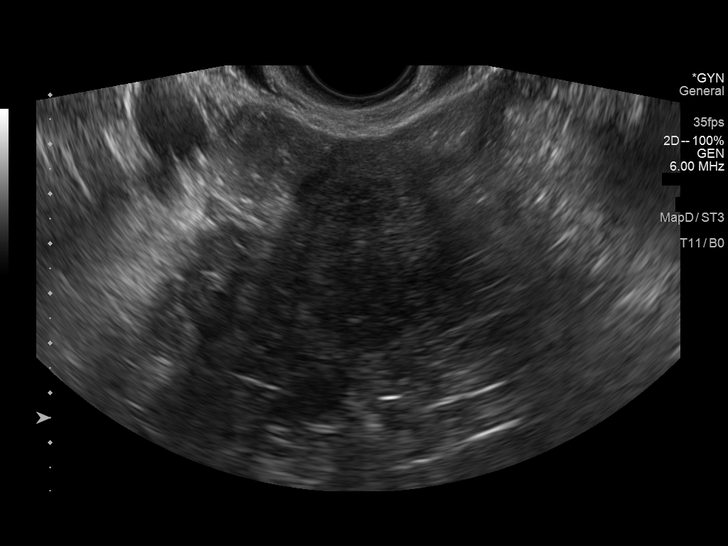
[im 49/66]
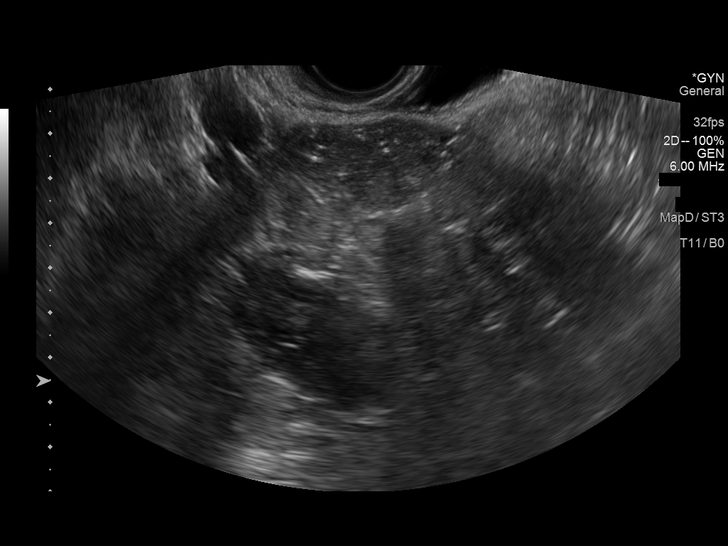
[im 55/66]
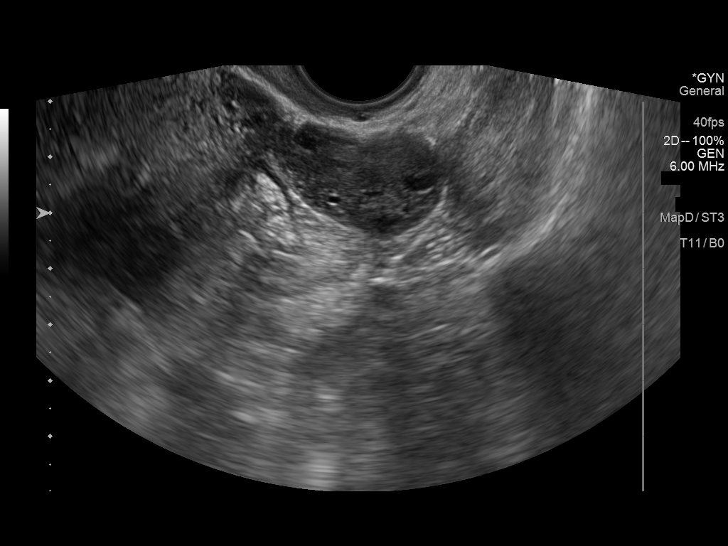
[im 60/66]
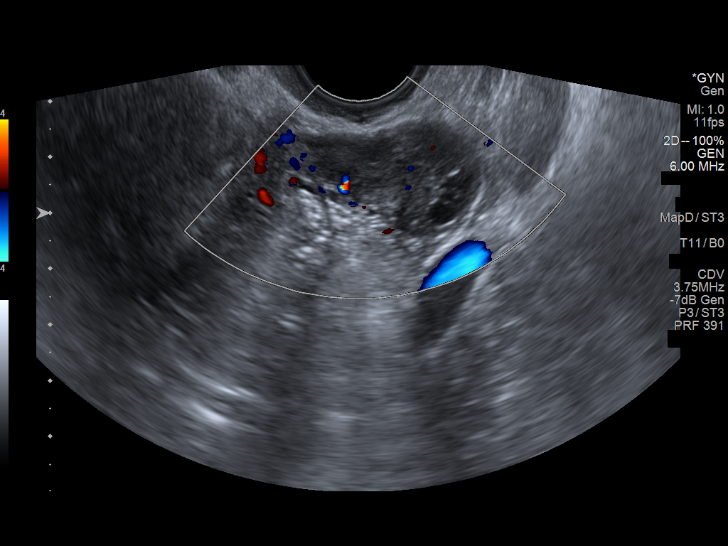
[im 66/66]
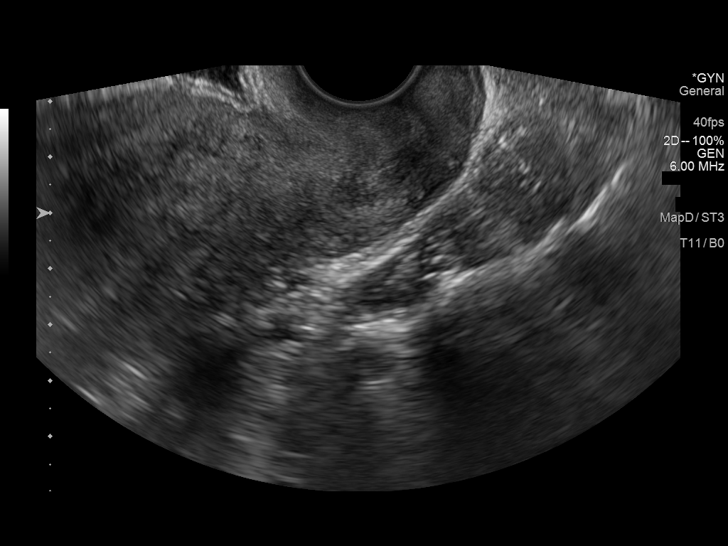

[14 of 25 positions shown; findings below may reference images not displayed]

FINDINGS: Uterus

Measurements: 11.2 x 4.4 x 5.8 cm = volume: 149 mL. Anteverted.
Heterogeneous myometrium. No focal mass.

Endometrium

Thickness: 7 mm.  No endometrial fluid or focal abnormality

Right ovary

Measurements: 3.0 x 2.6 x 4.2 cm = volume: 17.1 mL. Normal
morphology without mass

Left ovary

Measurements: 3.1 x 2.1 x 2.9 cm = volume: 9.7 mL. Normal morphology
without mass

Other findings

No free pelvic fluid.  No adnexal masses.
IMPRESSION: Normal exam.

## 2021-10-19 DIAGNOSIS — N920 Excessive and frequent menstruation with regular cycle: Secondary | ICD-10-CM | POA: Diagnosis not present

## 2021-10-19 DIAGNOSIS — Z01419 Encounter for gynecological examination (general) (routine) without abnormal findings: Secondary | ICD-10-CM | POA: Diagnosis not present

## 2021-10-19 LAB — HM PAP SMEAR

## 2021-10-26 DIAGNOSIS — N921 Excessive and frequent menstruation with irregular cycle: Secondary | ICD-10-CM | POA: Diagnosis not present

## 2021-12-20 DIAGNOSIS — N920 Excessive and frequent menstruation with regular cycle: Secondary | ICD-10-CM | POA: Diagnosis not present

## 2021-12-20 DIAGNOSIS — N644 Mastodynia: Secondary | ICD-10-CM | POA: Diagnosis not present

## 2022-03-29 DIAGNOSIS — N921 Excessive and frequent menstruation with irregular cycle: Secondary | ICD-10-CM | POA: Diagnosis not present

## 2022-05-17 ENCOUNTER — Encounter: Payer: Self-pay | Admitting: Nurse Practitioner

## 2022-06-07 ENCOUNTER — Encounter: Payer: Self-pay | Admitting: Nurse Practitioner

## 2022-06-07 ENCOUNTER — Ambulatory Visit: Payer: BC Managed Care – PPO | Admitting: Nurse Practitioner

## 2022-06-07 VITALS — BP 110/78 | HR 87 | Temp 97.4°F | Ht <= 58 in | Wt 187.4 lb

## 2022-06-07 DIAGNOSIS — Z6841 Body Mass Index (BMI) 40.0 and over, adult: Secondary | ICD-10-CM

## 2022-06-07 DIAGNOSIS — Z91018 Allergy to other foods: Secondary | ICD-10-CM | POA: Diagnosis not present

## 2022-06-07 MED ORDER — WEGOVY 0.25 MG/0.5ML ~~LOC~~ SOAJ: 0.2500 mg | SUBCUTANEOUS | 0 refills | Status: DC

## 2022-06-07 MED ORDER — EPINEPHRINE 0.3 MG/0.3ML IJ SOAJ: 0.3000 mg | INTRAMUSCULAR | 0 refills | Status: AC | PRN

## 2022-06-07 NOTE — Progress Notes (Signed)
Established Patient Visit  Patient: Andrea [name]   DOB: Aug 03, 1986   36 y.o. Female  MRN: [id number] Visit Date: 06/07/2022  Subjective:    Chief Complaint  Patient presents with   Office Visit    Wants to discuss weight loss options Has been trying to lose weight on her own for the past year w/o success.  No other concerns Requesting records from GYN   HPI Class 3 severe obesity with serious comorbidity and body mass index (BMI) of 45.0 to 49.9 in adult [facility]) Exercise: treadmil 5x/week, each day, and strength training. Diet: struggles to maintain small meal portions, admits she eats out of boredom. 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 We discussed use of Qsymia vs wegovy and their possible side effects and contraindications, types of low calorie snacks, and need for portion control. She agreed to start wegovy injection if covered by insurance. Current use of COC for contraception. wegovy rx sent F/up in 47month  Reviewed medical, surgical, and social history today  Medications: Outpatient Medications Prior to Visit  Medication Sig   HAILEY 24 FE 1-20 MG-MCG(24) tablet Take 1 tablet by mouth daily.   [DISCONTINUED] amoxicillin (AMOXIL) 875 MG tablet Take 1 tablet (875 mg total) by mouth 2 (two) times daily. (Patient not taking: Reported on 11/01/2020)   [DISCONTINUED] fluticasone (FLONASE) 50 MCG/ACT nasal spray Place 2 sprays into both nostrils daily. (Patient not taking: Reported on 11/01/2020)   [DISCONTINUED] Prenatal Vit-Fe Fumarate-FA (PRENATAL MULTIVITAMIN) TABS Take 1 tablet by mouth at bedtime. (Patient not taking: Reported on 11/01/2020)   No facility-administered medications prior to visit.   Reviewed past medical and social history.   ROS per HPI above      Objective:  BP 110/78 (BP Location: Right Arm, Patient Position: Sitting, Cuff Size: Normal)   Pulse 87   Temp (!) 97.4 F (36.3 C)  (Temporal)   Ht 4\' 5"  (1.346 m)   Wt 187 lb 6.4 oz (85 kg)   SpO2 96%   BMI 46.91 kg/m      Physical Exam Vitals reviewed.  Cardiovascular:     Rate and Rhythm: Normal rate and regular rhythm.     Pulses: Normal pulses.     Heart sounds: Normal heart sounds.  Pulmonary:     Effort: Pulmonary effort is normal.     Breath sounds: Normal breath sounds.  Musculoskeletal:     Right lower leg: No edema.     Left lower leg: No edema.  Neurological:     Mental Status: [name] alert and oriented to person, place, and time.  Psychiatric:        Mood and Affect: Mood normal.        Behavior: Behavior normal.        Thought Content: Thought content normal.     No results found for any visits on 06/07/22.    Assessment & Plan:    Problem List Items Addressed This Visit       Other   Class 3 severe obesity with serious comorbidity and body mass index (BMI) of 45.0 to 49.9 in adult [facility]) - Primary    Exercise: treadmil 5x/week, [facility] each day, and strength training. Diet: struggles to maintain small meal portions, admits she eats out of boredom. 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 We  discussed use of Qsymia vs wegovy and their possible side effects and contraindications, types of low calorie snacks, and need for portion control. She agreed to start wegovy injection if covered by insurance. Current use of COC for contraception. wegovy rx sent F/up in 15month      Relevant Medications   Semaglutide-Weight Management (WEGOVY) 0.25 MG/0.5ML SOAJ   Other Visit Diagnoses     Tree nut allergy       Relevant Medications   EPINEPHrine 0.3 mg/0.3 mL IJ SOAJ injection     I have spent with this patient regarding history taking, documentation, review of labs, radiology, specialty note, formulating plan and discussing treatment options with patient.   Return in about 4 weeks (around 07/05/2022) for weight management (fasting, needs  labs).     Alysia Penna, NP

## 2022-06-07 NOTE — Patient Instructions (Signed)
Start wegovy Medication will need prior authorization, so please wait for response from your insurance.  How to Increase Your Level of Physical Activity Getting regular physical activity is important for your overall health and well-being. Most people do not get enough exercise. There are easy ways to increase your level of physical activity, even if you have not been very active in the past or if you are just starting out. What are the benefits of physical activity? Physical activity has many short-term and long-term benefits. Being active on a regular basis can improve your physical and mental health as well as provide other benefits. Physical health benefits Helping you lose weight or maintain a healthy weight. Strengthening your muscles and bones. Reducing your risk of certain long-term (chronic) diseases, including heart disease, cancer, and diabetes. Being able to move around more easily and for longer periods of time without getting tired (increased endurance or stamina). Improving your ability to fight off illness (enhanced immunity). Being able to sleep better. Helping you stay healthy as you get older, including: Helping you stay mobile, or capable of walking and moving around. Preventing accidents, such as falls. Increasing life expectancy. Mental health benefits Boosting your mood and improving your self-esteem. Lowering your chance of having mental health problems, such as depression or anxiety. Helping you feel good about your body. Other benefits Finding new sources of fun and enjoyment. Meeting new people who share a common interest. Before you begin If you have a chronic illness or have not been active for a while, check with your health care provider about how to get started. Ask your health care provider what activities are safe for you. Start out slowly. Walking or doing some simple chair exercises is a good place to start, especially if you have not been active before  or for a long time. Set goals that you can work toward. Ask your health care provider how much exercise is best for you. In general, most adults should: Do moderate-intensity exercise for at least 150 minutes each week (30 minutes on most days of the week) or vigorous exercise for at least 75 minutes each week, or a combination of these. Moderate-intensity exercise can include walking at a quick pace, biking, yoga, water aerobics, or gardening. Vigorous exercise involves activities that take more effort, such as jogging or running, playing sports, swimming laps, or jumping rope. Do strength exercises on at least 2 days each week. This can include weight lifting, body weight exercises, and resistance-band exercises. How to be more physically active Make a plan  Try to find activities that you enjoy. You are more likely to commit to an exercise routine if it does not feel like a chore. If you have bone or joint problems, choose low-impact exercises, like walking or swimming. Use these tips for being successful with an exercise plan: Find a workout partner for accountability. Join a group or class, such as an aerobics class, cycling class, or sports team. Make family time active. Go for a walk, bike, or swim. Include a variety of exercises each week. Consider using a fitness tracker, such as a mobile phone app or a device worn like a watch, that will count the number of steps you take each day. Many people strive to reach 10,000 steps a day. Find ways to be active in your daily routines Besides your formal exercise plans, you can find ways to do physical activity during your daily routines, such as: Walking or biking to work or to the  store. Taking the stairs instead of the elevator. Parking farther away from the door at work or at the store. Planning walking meetings. Walking around while you are on the phone. Where to find more information Centers for Disease Control and Prevention:  WorkDashboard.es President's Council on Fitness, Sports & Nutrition: www.fitness.gov ChooseMyPlate: MassVoice.es Contact a health care provider if: You have headaches, muscle aches, or joint pain that is concerning. You feel dizzy or light-headed while exercising. You faint. You feel your heart skipping, racing, or fluttering. You have chest pain while exercising. Summary Exercise benefits your mind and body at any age, even if you are just starting out. If you have a chronic illness or have not been active for a while, check with your health care provider before increasing your physical activity. Choose activities that are safe and enjoyable for you. Ask your health care provider what activities are safe for you. Start slowly. Tell your health care provider if you have problems as you start to increase your activity level. This information is not intended to replace advice given to you by your health care provider. Make sure you discuss any questions you have with your health care provider. Document Revised: 03/11/2021 Document Reviewed: 03/11/2021 Elsevier Patient Education  [place] for [employer] Calories are units of energy. Your body needs a certain number of calories from food to keep going throughout the day. When you eat or drink more calories than your body needs, your body stores the extra calories mostly as fat. When you eat or drink fewer calories than your body needs, your body burns fat to get the energy it needs. Calorie counting means keeping track of how many calories you eat and drink each day. Calorie counting can be helpful if you need to lose weight. If you eat fewer calories than your body needs, you should lose weight. Ask your health care provider what a healthy weight is for you. For calorie counting to work, you will need to eat the right number of calories each day to lose a healthy amount of weight per week. A dietitian  can help you figure out how many calories you need in a day and will suggest ways to reach your calorie goal. A healthy amount of weight to lose each week is usually 1-2 lb (0.5-0.9 kg). This usually means that your daily calorie intake should be reduced by 500-750 calories. Eating 1,200-1,500 calories a day can help most women lose weight. Eating 1,500-1,800 calories a day can help most men lose weight. What do I need to know about calorie counting? Work with your health care provider or dietitian to determine how many calories you should get each day. To meet your daily calorie goal, you will need to: Find out how many calories are in each food that you would like to eat. Try to do this before you eat. Decide how much of the food you plan to eat. Keep a food log. Do this by writing down what you ate and how many calories it had. To successfully lose weight, it is important to balance calorie counting with a healthy lifestyle that includes regular activity. Where do I find calorie information?  The number of calories in a food can be found on a Nutrition Facts label. If a food does not have a Nutrition Facts label, try to look up the calories online or ask your dietitian for help. Remember that calories are listed per serving. If you choose  to have more than one serving of a food, you will have to multiply the calories per serving by the number of servings you plan to eat. For example, the label on a package of bread might say that a serving size is 1 slice and that there are 90 calories in a serving. If you eat 1 slice, you will have eaten 90 calories. If you eat 2 slices, you will have eaten 180 calories. How do I keep a food log? After each time that you eat, record the following in your food log as soon as possible: What you ate. Be sure to include toppings, sauces, and other extras on the food. How much you ate. This can be measured in cups, ounces, or number of items. How many calories were  in each food and drink. The total number of calories in the food you ate. Keep your food log near you, such as in a pocket-sized notebook or on an app or website on your mobile phone. Some programs will calculate calories for you and show you how many calories you have left to meet your daily goal. What are some portion-control tips? Know how many calories are in a serving. This will help you know how many servings you can have of a certain food. Use a measuring cup to measure serving sizes. You could also try weighing out portions on a kitchen scale. With time, you will be able to estimate serving sizes for some foods. Take time to put servings of different foods on your favorite plates or in your favorite bowls and cups so you know what a serving looks like. Try not to eat straight from a food's packaging, such as from a bag or box. Eating straight from the package makes it hard to see how much you are eating and can lead to overeating. Put the amount you would like to eat in a cup or on a plate to make sure you are eating the right portion. Use smaller plates, glasses, and bowls for smaller portions and to prevent overeating. Try not to multitask. For example, avoid watching TV or using your computer while eating. If it is time to eat, sit down at a table and enjoy your food. This will help you recognize when you are full. It will also help you be more mindful of what and how much you are eating. What are tips for following this plan? Reading food labels Check the calorie count compared with the serving size. The serving size may be smaller than what you are used to eating. Check the source of the calories. Try to choose foods that are high in protein, fiber, and vitamins, and low in saturated fat, trans fat, and sodium. Shopping Read nutrition labels while you shop. This will help you make healthy decisions about which foods to buy. Pay attention to nutrition labels for low-fat or fat-free foods.  These foods sometimes have the same number of calories or more calories than the full-fat versions. They also often have added sugar, starch, or salt to make up for flavor that was removed with the fat. Make a grocery list of lower-calorie foods and stick to it. Cooking Try to cook your favorite foods in a healthier way. For example, try baking instead of frying. Use low-fat dairy products. Meal planning Use more fruits and vegetables. One-half of your plate should be fruits and vegetables. Include lean proteins, such as chicken, [place], and fish. Lifestyle Each week, aim to do one of the  following: 150 minutes of moderate exercise, such as walking. 75 minutes of vigorous exercise, such as running. General information Know how many calories are in the foods you eat most often. This will help you calculate calorie counts faster. Find a way of tracking calories that works for you. Get creative. Try different apps or programs if writing down calories does not work for you. What foods should I eat?  Eat nutritious foods. It is better to have a nutritious, high-calorie food, such as an avocado, than a food with few nutrients, such as a bag of potato chips. Use your calories on foods and drinks that will fill you up and will not leave you hungry soon after eating. Examples of foods that fill you up are nuts and nut butters, vegetables, lean proteins, and high-fiber foods such as whole grains. High-fiber foods are foods with more than 5 g of fiber per serving. Pay attention to calories in drinks. Low-calorie drinks include water and unsweetened drinks. The items listed above may not be a complete list of foods and beverages you can eat. Contact a dietitian for more information. What foods should I limit? Limit foods or drinks that are not good sources of vitamins, minerals, or protein or that are high in unhealthy fats. These include: Candy. Other sweets. Sodas, specialty coffee drinks, alcohol,  and juice. The items listed above may not be a complete list of foods and beverages you should avoid. Contact a dietitian for more information. How do I count calories when eating out? Pay attention to portions. Often, portions are much larger when eating out. Try these tips to keep portions smaller: Consider sharing a meal instead of getting your own. If you get your own meal, eat only half of it. Before you start eating, ask for a container and put half of your meal into it. When available, consider ordering smaller portions from the menu instead of full portions. Pay attention to your food and drink choices. Knowing the way food is cooked and what is included with the meal can help you eat fewer calories. If calories are listed on the menu, choose the lower-calorie options. Choose dishes that include vegetables, fruits, whole grains, low-fat dairy products, and lean proteins. Choose items that are boiled, broiled, grilled, or steamed. Avoid items that are buttered, battered, fried, or served with cream sauce. Items labeled as crispy are usually fried, unless stated otherwise. Choose water, low-fat milk, unsweetened iced tea, or other drinks without added sugar. If you want an alcoholic beverage, choose a lower-calorie option, such as a glass of wine or light beer. Ask for dressings, sauces, and syrups on the side. These are usually high in calories, so you should limit the amount you eat. If you want a salad, choose a garden salad and ask for grilled meats. Avoid extra toppings such as bacon, cheese, or fried items. Ask for the dressing on the side, or ask for olive oil and vinegar or lemon to use as dressing. Estimate how many servings of a food you are given. Knowing serving sizes will help you be aware of how much food you are eating at restaurants. Where to find more information Centers for Disease Control and Prevention: FootballExhibition.com.br U.S. Department of Agriculture:  WrestlingReporter.dk Summary Calorie counting means keeping track of how many calories you eat and drink each day. If you eat fewer calories than your body needs, you should lose weight. A healthy amount of weight to lose per week is usually 1-2 lb (0.5-0.9  kg). This usually means reducing your daily calorie intake by 500-750 calories. The number of calories in a food can be found on a Nutrition Facts label. If a food does not have a Nutrition Facts label, try to look up the calories online or ask your dietitian for help. Use smaller plates, glasses, and bowls for smaller portions and to prevent overeating. Use your calories on foods and drinks that will fill you up and not leave you hungry shortly after a meal. This information is not intended to replace advice given to you by your health care provider. Make sure you discuss any questions you have with your health care provider. Document Revised: 12/25/2019 Document Reviewed: 12/25/2019 Elsevier Patient Education  2023 [employer].

## 2022-06-07 NOTE — Assessment & Plan Note (Addendum)
Exercise: treadmil 5x/week, each day, and strength training. Diet: struggles to maintain small meal portions, admits she eats out of boredom. 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 Denies any anxiety or depression. We discussed use of Qsymia vs wegovy and their possible side effects and contraindications, types of low calorie snacks, and need for portion control. She agreed to start wegovy injection if covered by insurance. Current use of COC for contraception. wegovy rx sent F/up in 35month

## 2022-08-30 ENCOUNTER — Ambulatory Visit: Payer: BC Managed Care – PPO | Admitting: Nurse Practitioner

## 2022-09-12 ENCOUNTER — Ambulatory Visit: Payer: BC Managed Care – PPO | Admitting: Nurse Practitioner

## 2022-10-02 DIAGNOSIS — O039 Complete or unspecified spontaneous abortion without complication: Secondary | ICD-10-CM | POA: Diagnosis not present

## 2022-10-02 DIAGNOSIS — N911 Secondary amenorrhea: Secondary | ICD-10-CM | POA: Diagnosis not present

## 2022-10-02 DIAGNOSIS — O021 Missed abortion: Secondary | ICD-10-CM | POA: Diagnosis not present

## 2022-10-06 ENCOUNTER — Encounter: Payer: Self-pay | Admitting: Nurse Practitioner

## 2022-10-06 DIAGNOSIS — E782 Mixed hyperlipidemia: Secondary | ICD-10-CM

## 2022-10-06 MED ORDER — WEGOVY 1 MG/0.5ML ~~LOC~~ SOAJ: 1.0000 mg | SUBCUTANEOUS | 0 refills | Status: DC

## 2022-10-18 DIAGNOSIS — O021 Missed abortion: Secondary | ICD-10-CM | POA: Diagnosis not present

## 2022-11-09 ENCOUNTER — Other Ambulatory Visit: Payer: Self-pay | Admitting: Nurse Practitioner

## 2022-11-09 DIAGNOSIS — E782 Mixed hyperlipidemia: Secondary | ICD-10-CM

## 2022-11-13 ENCOUNTER — Encounter: Payer: Self-pay | Admitting: Nurse Practitioner

## 2022-11-13 ENCOUNTER — Ambulatory Visit: Payer: BC Managed Care – PPO | Admitting: Nurse Practitioner

## 2022-11-13 DIAGNOSIS — Z6841 Body Mass Index (BMI) 40.0 and over, adult: Secondary | ICD-10-CM

## 2022-11-13 DIAGNOSIS — E782 Mixed hyperlipidemia: Secondary | ICD-10-CM

## 2022-11-13 DIAGNOSIS — H6502 Acute serous otitis media, left ear: Secondary | ICD-10-CM | POA: Diagnosis not present

## 2022-11-13 LAB — COMPREHENSIVE METABOLIC PANEL
ALT: 19 U/L (ref 0–35)
AST: 17 U/L (ref 0–37)
Albumin: 4.3 g/dL (ref 3.5–5.2)
Alkaline Phosphatase: 41 U/L (ref 39–117)
BUN: 7 mg/dL (ref 6–23)
CO2: 27 mEq/L (ref 19–32)
Calcium: 9.4 mg/dL (ref 8.4–10.5)
Chloride: 103 mEq/L (ref 96–112)
Creatinine, Ser: 0.78 mg/dL (ref 0.40–1.20)
GFR: 98.14 mL/min (ref >60.00)
Glucose, Bld: 79 mg/dL (ref 70–99)
Potassium: 4.1 mEq/L (ref 3.5–5.1)
Sodium: 139 mEq/L (ref 135–145)
Total Bilirubin: 0.6 mg/dL (ref 0.2–1.2)
Total Protein: 7 g/dL (ref 6.0–8.3)

## 2022-11-13 LAB — LIPID PANEL
Cholesterol: 184 mg/dL (ref 0–200)
HDL: 43.1 mg/dL (ref >39.00)
LDL Cholesterol: 128 mg/dL — ABNORMAL HIGH (ref 0–99)
NonHDL: 141.21
Total CHOL/HDL Ratio: 4
Triglycerides: 66 mg/dL (ref 0.0–149.0)
VLDL: 13.2 mg/dL (ref 0.0–40.0)

## 2022-11-13 MED ORDER — WEGOVY 2.4 MG/0.75ML ~~LOC~~ SOAJ: 2.4000 mg | SUBCUTANEOUS | 0 refills | Status: DC

## 2022-11-13 MED ORDER — CEFDINIR 300 MG PO CAPS: 300.0000 mg | ORAL_CAPSULE | Freq: Two times a day (BID) | ORAL | 0 refills | Status: DC

## 2022-11-13 NOTE — Progress Notes (Signed)
Established Patient Visit  Patient: Andrea [name]   DOB: 1986/07/08   36 y.o. Female  MRN: [id number] Visit Date: 11/13/2022  Subjective:    Chief Complaint  Patient presents with   Office Visit    Weight management f/u  C/o left ear pain & sore throat , pain radiates down the back of her neck  requesting records for pap   Otalgia  There is pain in the left ear. This is a new problem. The current episode started in the past 7 days. The problem occurs constantly. The problem has been gradually worsening. There has been no fever. Associated symptoms include a sore throat. Pertinent negatives include no abdominal pain, coughing, diarrhea, ear discharge, headaches, hearing loss, neck pain, rash, rhinorrhea or vomiting. She has tried acetaminophen for the symptoms. The treatment provided no relief. There is no history of a chronic ear infection, hearing loss or a tympanostomy tube.   Obesity Lost 21lbs in last 68months with wegovy 1mg  weekly, diet (low fat/low carb/high fiber/high protein), and Exercise: (walking 4x/week) No adverse effects with wegovy Wt Readings from Last 3 Encounters:  11/13/22 166 lb 6.4 oz (75.5 kg)  06/07/22 187 lb 6.4 oz (85 kg)  11/01/20 188 lb (85.3 kg)    Advised to add strength training exercise, advised about DDI between wegovy and COC. She verbalized understanding and plans to f/up with GYN. Increase wegovy to 2.4mg  weekly Check CMP and lipid panel today F/up in 47month  Wt Readings from Last 3 Encounters:  11/13/22 166 lb 6.4 oz (75.5 kg)  06/07/22 187 lb 6.4 oz (85 kg)  11/01/20 188 lb (85.3 kg)    BP Readings from Last 3 Encounters:  11/13/22 116/76  06/07/22 110/78  11/01/20 110/80    Reviewed medical, surgical, and social history today  Medications: Outpatient Medications Prior to Visit  Medication Sig   EPINEPHrine 0.3 mg/0.3 mL IJ SOAJ injection Inject 0.3 mg into the muscle as needed for anaphylaxis.   HAILEY 24 FE 1-20  MG-MCG(24) tablet Take 1 tablet by mouth daily.   [DISCONTINUED] Semaglutide-Weight Management (WEGOVY) 1 MG/0.5ML SOAJ Inject 1 mg into the skin once a week.   No facility-administered medications prior to visit.   Reviewed past medical and social history.   ROS per HPI above      Objective:  BP 116/76 (BP Location: Right Arm, Patient Position: Sitting, Cuff Size: Normal)   Pulse 80   Temp 97.6 F (36.4 C) (Temporal)   Ht 4\' 5"  (1.346 m)   Wt 166 lb 6.4 oz (75.5 kg)   SpO2 97%   BMI 41.65 kg/m      Physical Exam HENT:     Right Ear: Ear canal and external ear normal. A middle ear effusion is present. There is no impacted cerumen. Tympanic membrane is not injected, scarred, perforated, erythematous, retracted or bulging.     Left Ear: External ear normal. A middle ear effusion is present. There is no impacted cerumen. Tympanic membrane is injected and erythematous. Tympanic membrane is not scarred, perforated, retracted or bulging.  Cardiovascular:     Rate and Rhythm: Normal rate and regular rhythm.     Pulses: Normal pulses.     Heart sounds: Normal heart sounds.  Pulmonary:     Effort: Pulmonary effort is normal.     Breath sounds: Normal breath sounds.  Musculoskeletal:     Cervical back: Normal range  of motion and neck supple.  Lymphadenopathy:     Cervical: No cervical adenopathy.  Neurological:     Mental Status: [name] alert and oriented to person, place, and time.     No results found for any visits on 11/13/22.    Assessment & Plan:    Problem List Items Addressed This Visit       Other   Mixed hyperlipidemia   Relevant Orders   Comprehensive metabolic panel   Lipid panel   Obesity - Primary    Lost 21lbs in last 32months with wegovy 1mg  weekly, diet (low fat/low carb/high fiber/high protein), and Exercise: (walking 4x/week) No adverse effects with wegovy Wt Readings from Last 3 Encounters:  11/13/22 166 lb 6.4 oz (75.5 kg)  06/07/22 187 lb 6.4 oz  (85 kg)  11/01/20 188 lb (85.3 kg)    Advised to add strength training exercise, advised about DDI between wegovy and COC. She verbalized understanding and plans to f/up with GYN. Increase wegovy to 2.4mg  weekly Check CMP and lipid panel today F/up in 69month      Relevant Medications   Semaglutide-Weight Management (WEGOVY) 2.4 MG/0.75ML SOAJ   Other Visit Diagnoses     Non-recurrent acute serous otitis media of left ear       Relevant Medications   cefdinir (OMNICEF) 300 MG capsule      Return in about 4 weeks (around 12/11/2022) for Weight management.     12/13/2022, NP

## 2022-11-13 NOTE — Patient Instructions (Addendum)
Start cefdinir 300mg  BID and zyrtec 10mg  daily x 10days for ear infection. Ok to use ibuprofen or tylenol for pain, take with food.  Increase wegovy to 2.4mg  weekly Maintain low fat/low carb diet. Maintain of moderate intensity exercise per week.  Discuss need to change oral contraception with your GYN. can decrease effectiveness of oral contraception.  Go to lab  How to Increase Your Level of Physical Activity Getting regular physical activity is important for your overall health and well-being. Most people do not get enough exercise. There are easy ways to increase your level of physical activity, even if you have not been very active in the past or if you are just starting out. What are the benefits of physical activity? Physical activity has many short-term and long-term benefits. Being active on a regular basis can improve your physical and mental health as well as provide other benefits. Physical health benefits Helping you lose weight or maintain a healthy weight. Strengthening your muscles and bones. Reducing your risk of certain long-term (chronic) diseases, including heart disease, cancer, and diabetes. Being able to move around more easily and for longer periods of time without getting tired (increased endurance or stamina). Improving your ability to fight off illness (enhanced immunity). Being able to sleep better. Helping you stay healthy as you get older, including: Helping you stay mobile, or capable of walking and moving around. Preventing accidents, such as falls. Increasing life expectancy. Mental health benefits Boosting your mood and improving your self-esteem. Lowering your chance of having mental health problems, such as depression or anxiety. Helping you feel good about your body. Other benefits Finding new sources of fun and enjoyment. Meeting new people who share a common interest. Before you begin If you have a chronic illness or have not  been active for a while, check with your health care provider about how to get started. Ask your health care provider what activities are safe for you. Start out slowly. Walking or doing some simple chair exercises is a good place to start, especially if you have not been active before or for a long time. Set goals that you can work toward. Ask your health care provider how much exercise is best for you. In general, most adults should: Do moderate-intensity exercise for at least 150 minutes each week (30 minutes on most days of the week) or vigorous exercise for at least 75 minutes each week, or a combination of these. Moderate-intensity exercise can include walking at a quick pace, biking, yoga, water aerobics, or gardening. Vigorous exercise involves activities that take more effort, such as jogging or running, playing sports, swimming laps, or jumping rope. Do strength exercises on at least 2 days each week. This can include weight lifting, body weight exercises, and resistance-band exercises. How to be more physically active Make a plan  Try to find activities that you enjoy. You are more likely to commit to an exercise routine if it does not feel like a chore. If you have bone or joint problems, choose low-impact exercises, like walking or swimming. Use these tips for being successful with an exercise plan: Find a workout partner for accountability. Join a group or class, such as an aerobics class, cycling class, or sports team. Make family time active. Go for a walk, bike, or swim. Include a variety of exercises each week. Consider using a fitness tracker, such as a mobile phone app or a device worn like a watch, that will count the number of steps  you take each day. Many people strive to reach 10,000 steps a day. Find ways to be active in your daily routines Besides your formal exercise plans, you can find ways to do physical activity during your daily routines, such as: Walking or biking  to work or to the store. Taking the stairs instead of the elevator. Parking farther away from the door at work or at the store. Planning walking meetings. Walking around while you are on the phone. Where to find more information Centers for Disease Control and Prevention: [employer].pt [employer]'s Council on Fitness, Sports & Nutrition: www.fitness.gov ChooseMyPlate: http://www.harvey.com/ Contact a health care provider if: You have headaches, muscle aches, or joint pain that is concerning. You feel dizzy or light-headed while exercising. You faint. You feel your heart skipping, racing, or fluttering. You have chest pain while exercising. Summary Exercise benefits your mind and body at any age, even if you are just starting out. If you have a chronic illness or have not been active for a while, check with your health care provider before increasing your physical activity. Choose activities that are safe and enjoyable for you. Ask your health care provider what activities are safe for you. Start slowly. Tell your health care provider if you have problems as you start to increase your activity level. This information is not intended to replace advice given to you by your health care provider. Make sure you discuss any questions you have with your health care provider. Document Revised: 03/11/2021 Document Reviewed: 03/11/2021 Elsevier Patient Education  2023 [employer].

## 2022-11-13 NOTE — Assessment & Plan Note (Addendum)
Lost 21lbs in last 81months with wegovy 1mg  weekly, diet (low fat/low carb/high fiber/high protein), and Exercise: (walking 4x/week) No adverse effects with wegovy Wt Readings from Last 3 Encounters:  11/13/22 166 lb 6.4 oz (75.5 kg)  06/07/22 187 lb 6.4 oz (85 kg)  11/01/20 188 lb (85.3 kg)    Advised to add strength training exercise, advised about DDI between wegovy and COC. She verbalized understanding and plans to f/up with GYN. Increase wegovy to 2.4mg  weekly Check CMP and lipid panel today F/up in 85month

## 2022-12-01 ENCOUNTER — Encounter: Payer: Self-pay | Admitting: Nurse Practitioner

## 2022-12-05 ENCOUNTER — Encounter: Payer: Self-pay | Admitting: Nurse Practitioner

## 2022-12-05 ENCOUNTER — Ambulatory Visit (INDEPENDENT_AMBULATORY_CARE_PROVIDER_SITE_OTHER): Payer: BC Managed Care – PPO | Admitting: Nurse Practitioner

## 2022-12-05 ENCOUNTER — Other Ambulatory Visit: Payer: Self-pay | Admitting: Nurse Practitioner

## 2022-12-05 VITALS — BP 108/62 | HR 81 | Ht 59.5 in | Wt 159.0 lb

## 2022-12-05 DIAGNOSIS — E6609 Other obesity due to excess calories: Secondary | ICD-10-CM

## 2022-12-05 DIAGNOSIS — Z111 Encounter for screening for respiratory tuberculosis: Secondary | ICD-10-CM | POA: Diagnosis not present

## 2022-12-05 DIAGNOSIS — Z0000 Encounter for general adult medical examination without abnormal findings: Secondary | ICD-10-CM | POA: Diagnosis not present

## 2022-12-05 DIAGNOSIS — E782 Mixed hyperlipidemia: Secondary | ICD-10-CM

## 2022-12-05 MED ORDER — WEGOVY 2.4 MG/0.75ML ~~LOC~~ SOAJ: 2.4000 mg | SUBCUTANEOUS | 2 refills | Status: DC

## 2022-12-05 NOTE — Progress Notes (Signed)
Complete physical exam  Patient: Andrea [name]   DOB: 09-02-1986   36 y.o. Female  MRN: [id number] Visit Date: 12/05/2022  Subjective:    Chief Complaint  Patient presents with  . Annual Exam    Physical , pt has  other concerns    Andrea [name] is a 37 y.o. female who presents today for a complete physical exam. She reports consuming a low fat diet.  3-4x/week  She generally feels well. She reports sleeping well. She does not have additional problems to discuss today.  Vision:Yes Dental:No STD Screen:No  Needs work form completed. Form requires TB skin test. She denies any known TB exposure, no upper respiratory symptoms, no night sweats, no unintended weight loss.  Most recent fall risk assessment:    12/05/2022    1:04 PM  Fall Risk   Falls in the past year? 0  Risk for fall due to : No Fall Risks  Follow up Falls evaluation completed   Depression screen:Yes - No Depression  Most recent depression screenings:    12/05/2022    1:04 PM 06/07/2022   10:53 AM  PHQ 2/9 Scores  PHQ - 2 Score 0 0   HPI  Obesity Diet: small meal portion, low carb and high protein. Exercise:30-34mins cardio, 3-4x/week. Reports nausea and vomiting with large meals or eating before bedtime. Weight loss in last 57month: 7lbs Total weight loss in last 67months: 28lbs Wt Readings from Last 3 Encounters:  12/05/22 159 lb (72.1 kg)  11/13/22 166 lb 6.4 oz (75.5 kg)  06/07/22 187 lb 6.4 oz (85 kg)    Maintain med dose Advised to avoid large meals and no eating within 2hrs of bedtime. F/up in 74months  Past Medical History:  Diagnosis Date  . Constipation 08/16/2018  . Headache(784.0)   . Infertility, anovulation 05/21/2017  . Ovarian mass, right 05/21/2017  . Uterine contractions during pregnancy 01/24/2018  . Uterine size date discrepancy pregnancy, second trimester 10/30/2017   Past Surgical History:  Procedure Laterality Date  . NO PAST SURGERIES     Social History   Socioeconomic  History  . Marital status: Married    Spouse name: Not on file  . Number of children: 2  . Years of education: Not on file  . Highest education level: Not on file  Occupational History    Comment: housewife  Tobacco Use  . Smoking status: Never  . Smokeless tobacco: Never  Vaping Use  . Vaping Use: Never used  Substance and Sexual Activity  . Alcohol use: No  . Drug use: No  . Sexual activity: Yes    Birth control/protection: Condom  Other Topics Concern  . Not on file  Social History Narrative  . Not on file   Social Determinants of Health   Financial Resource Strain: Not on file  Food Insecurity: Not on file  Transportation Needs: Not on file  Physical Activity: Not on file  Stress: Not on file  Social Connections: Not on file  Intimate Partner Violence: Not on file   Family Status  Relation Name Status  . Mother  Alive   Family History  Problem Relation Age of Onset  . Hepatitis C Mother    Allergies  Allergen Reactions  . Almond (Diagnostic) Anaphylaxis    Tougue swelling and mouth itching    Patient Care Team: Kenzo Ozment, Bonna Gains, NP as PCP - General (Internal Medicine) Ob/Gyn, Nestor Ramp   Medications: Outpatient Medications Prior to Visit  Medication  Sig  . EPINEPHrine 0.3 mg/0.3 mL IJ SOAJ injection Inject 0.3 mg into the muscle as needed for anaphylaxis.  [name] HAILEY 24 FE 1-20 MG-MCG(24) tablet Take 1 tablet by mouth daily.  . [DISCONTINUED] Semaglutide-Weight Management (WEGOVY) 2.4 MG/0.75ML SOAJ Inject 2.4 mg into the skin once a week.  . [DISCONTINUED] cefdinir (OMNICEF) 300 MG capsule Take 1 capsule (300 mg total) by mouth 2 (two) times daily. (Patient not taking: Reported on 12/05/2022)   No facility-administered medications prior to visit.   Review of Systems  Constitutional:  Negative for fever.  HENT:  Negative for congestion and sore throat.   Eyes:        Negative for visual changes  Respiratory:  Negative for cough and shortness of  breath.   Cardiovascular:  Negative for chest pain, palpitations and leg swelling.  Gastrointestinal:  Negative for blood in stool, constipation and diarrhea.  Genitourinary:  Negative for dysuria, frequency and urgency.  Musculoskeletal:  Negative for myalgias.  Skin:  Negative for rash.  Neurological:  Negative for dizziness and headaches.  Hematological:  Does not bruise/bleed easily.  Psychiatric/Behavioral:  Negative for suicidal ideas. The patient is not nervous/anxious.        Objective:  BP 108/62   Pulse 81   Ht 4' 11.5" (1.511 m)   Wt 159 lb (72.1 kg)   LMP 12/03/2022 (Exact Date)   SpO2 98%   BMI 31.58 kg/m     Physical Exam Vitals and nursing note reviewed.  Constitutional:      General: [name] not in acute distress.    Appearance: [name] well-developed. [name] obese.  HENT:     Right Ear: External ear normal.     Left Ear: External ear normal.     Nose: Nose normal.     Mouth/Throat:     Pharynx: No oropharyngeal exudate.  Eyes:     Conjunctiva/sclera: Conjunctivae normal.     Pupils: Pupils are equal, round, and reactive to light.  Cardiovascular:     Rate and Rhythm: Normal rate and regular rhythm.     Pulses: Normal pulses.     Heart sounds: Normal heart sounds.  Pulmonary:     Effort: Pulmonary effort is normal. No respiratory distress.     Breath sounds: Normal breath sounds.  Chest:     Chest wall: No tenderness.  Abdominal:     General: Bowel sounds are normal.     Palpations: Abdomen is soft.  Genitourinary:    Comments: Breast and pelvic exam deferred to GYN Musculoskeletal:        General: Normal range of motion.     Cervical back: Normal range of motion and neck supple.     Right lower leg: No edema.     Left lower leg: No edema.  Lymphadenopathy:     Cervical: No cervical adenopathy.  Skin:    General: Skin is warm and dry.  Neurological:     Mental Status: [name] alert and oriented to person, place, and time.     Deep Tendon  Reflexes: Reflexes are normal and symmetric.  Psychiatric:        Mood and Affect: Mood normal.        Behavior: Behavior normal.        Thought Content: Thought content normal.    No results found for any visits on 12/05/22.    Assessment & Plan:    Routine Health Maintenance and Physical Exam  Immunization History  Administered Date(s) Administered  . Influenza,inj,Quad PF,6+ Mos 08/06/2017, 08/16/2018, 08/22/2019, 11/01/2020  . Janssen (J&J) SARS-COV-2 Vaccination 05/01/2020  . Tdap 03/04/2013, 10/30/2017   Health Maintenance  Topic Date Due  . Hepatitis C Screening  Never done  . PAP SMEAR-Modifier  08/16/2021  . COVID-19 Vaccine (2 - 2023-24 season) 07/28/2022  . INFLUENZA VACCINE  02/25/2023 (Originally 06/27/2022)  . DTaP/Tdap/Td (3 - Td or Tdap) 10/31/2027  . HIV Screening  Completed  . HPV VACCINES  Aged Out   Discussed health benefits of physical activity, and encouraged her to engage in regular exercise appropriate for her age and condition.  Problem List Items Addressed This Visit       Other   Mixed hyperlipidemia   Relevant Medications   Semaglutide-Weight Management (WEGOVY) 2.4 MG/0.75ML SOAJ   Obesity    Diet: small meal portion, low carb and high protein. Exercise:30-8mins cardio, 3-4x/week. Reports nausea and vomiting with large meals or eating before bedtime. Weight loss in last 29month: 7lbs Total weight loss in last 69months: 28lbs Wt Readings from Last 3 Encounters:  12/05/22 159 lb (72.1 kg)  11/13/22 166 lb 6.4 oz (75.5 kg)  06/07/22 187 lb 6.4 oz (85 kg)    Maintain med dose Advised to avoid large meals and no eating within 2hrs of bedtime. F/up in 53months      Relevant Medications   Semaglutide-Weight Management (WEGOVY) 2.4 MG/0.75ML SOAJ   Other Visit Diagnoses     Preventative health care    -  Primary   Screening-pulmonary TB          Return in about 3 months (around 03/06/2023) for Weight management, hyperlipidemia  (fasting).     Alysia Penna, NP

## 2022-12-05 NOTE — Assessment & Plan Note (Addendum)
Diet: small meal portion, low carb and high protein. Exercise:30-90mins cardio, 3-4x/week. Reports nausea and vomiting with large meals or eating before bedtime. Weight loss in last 52month: 7lbs Total weight loss in last 65months: 28lbs Wt Readings from Last 3 Encounters:  12/05/22 159 lb (72.1 kg)  11/13/22 166 lb 6.4 oz (75.5 kg)  06/07/22 187 lb 6.4 oz (85 kg)    Maintain med dose Advised to avoid large meals and no eating within 2hrs of bedtime. F/up in 52months

## 2022-12-05 NOTE — Patient Instructions (Addendum)
Sign medical release to get records from GYN Schedule nurse visit for Tb skin test to be read in 48-72hrs. Bring work form to be completed. Maintain wegovy dose. F/up in 41months (fasting)  Preventive Care 36-36 Years Old, Female Preventive care refers to lifestyle choices and visits with your health care provider that can promote health and wellness. Preventive care visits are also called wellness exams. What can I expect for my preventive care visit? Counseling During your preventive care visit, your health care provider may ask about your: Medical history, including: Past medical problems. Family medical history. Pregnancy history. Current health, including: Menstrual cycle. Method of birth control. Emotional well-being. Home life and relationship well-being. Sexual activity and sexual health. Lifestyle, including: Alcohol, nicotine or tobacco, and drug use. Access to firearms. Diet, exercise, and sleep habits. Work and work Astronomer. Sunscreen use. Safety issues such as seatbelt and bike helmet use. Physical exam Your health care provider may check your: Height and weight. These may be used to calculate your BMI (body mass index). BMI is a measurement that tells if you are at a healthy weight. Waist circumference. This measures the distance around your waistline. This measurement also tells if you are at a healthy weight and may help predict your risk of certain diseases, such as type 2 diabetes and high blood pressure. Heart rate and blood pressure. Body temperature. Skin for abnormal spots. What immunizations do I need?  Vaccines are usually given at various ages, according to a schedule. Your health care provider will recommend vaccines for you based on your age, medical history, and lifestyle or other factors, such as travel or where you work. What tests do I need? Screening Your health care provider may recommend screening tests for certain conditions. This may  include: Pelvic exam and Pap test. Lipid and cholesterol levels. Diabetes screening. This is done by checking your blood sugar (glucose) after you have not eaten for a while (fasting). Hepatitis B test. Hepatitis C test. HIV (human immunodeficiency virus) test. STI (sexually transmitted infection) testing, if you are at risk. BRCA-related cancer screening. This may be done if you have a family history of breast, ovarian, tubal, or peritoneal cancers. Talk with your health care provider about your test results, treatment options, and if necessary, the need for more tests. Follow these instructions at home: Eating and drinking  Eat a healthy diet that includes fresh fruits and vegetables, whole grains, lean protein, and low-fat dairy products. Take vitamin and mineral supplements as recommended by your health care provider. Do not drink alcohol if: Your health care provider tells you not to drink. You are pregnant, may be pregnant, or are planning to become pregnant. If you drink alcohol: Limit how much you have to 0-1 drink a day. Know how much alcohol is in your drink. In the [place] one drink equals one 12 oz bottle of beer (355 mL), one 5 oz glass of wine (148 mL), or one 1 oz glass of hard liquor (44 mL). Lifestyle Brush your teeth every morning and night with fluoride toothpaste. Floss one time each day. Exercise for at least 30 minutes 5 or more days each week. Do not use any products that contain nicotine or tobacco. These products include cigarettes, chewing tobacco, and vaping devices, such as e-cigarettes. If you need help quitting, ask your health care provider. Do not use drugs. If you are sexually active, practice safe sex. Use a condom or other form of protection to prevent STIs. If you do not  wish to become pregnant, use a form of birth control. If you plan to become pregnant, see your health care provider for a prepregnancy visit. Find healthy ways to manage stress, such  as: Meditation, yoga, or listening to music. Journaling. Talking to a trusted person. Spending time with friends and family. Minimize exposure to UV radiation to reduce your risk of skin cancer. Safety Always wear your seat belt while driving or riding in a vehicle. Do not drive: If you have been drinking alcohol. Do not ride with someone who has been drinking. If you have been using any mind-altering substances or drugs. While texting. When you are tired or distracted. Wear a helmet and other protective equipment during sports activities. If you have firearms in your house, make sure you follow all gun safety procedures. Seek help if you have been physically or sexually abused. What's next? Go to your health care provider once a year for an annual wellness visit. Ask your health care provider how often you should have your eyes and teeth checked. Stay up to date on all vaccines. This information is not intended to replace advice given to you by your health care provider. Make sure you discuss any questions you have with your health care provider. Document Revised: 05/11/2021 Document Reviewed: 05/11/2021 Elsevier Patient Education  [employer].

## 2022-12-05 NOTE — Addendum Note (Signed)
Addended by: Tyrone Apple on: 12/05/2022 02:03 PM   Modules accepted: Orders

## 2022-12-07 ENCOUNTER — Ambulatory Visit: Payer: BC Managed Care – PPO

## 2022-12-13 ENCOUNTER — Ambulatory Visit: Payer: BC Managed Care – PPO | Admitting: Nurse Practitioner

## 2022-12-19 ENCOUNTER — Ambulatory Visit: Payer: BC Managed Care – PPO | Admitting: Nurse Practitioner

## 2023-01-16 ENCOUNTER — Other Ambulatory Visit: Payer: Self-pay | Admitting: Nurse Practitioner

## 2023-01-16 ENCOUNTER — Encounter: Payer: Self-pay | Admitting: Nurse Practitioner

## 2023-01-17 ENCOUNTER — Other Ambulatory Visit: Payer: Self-pay

## 2023-01-17 ENCOUNTER — Other Ambulatory Visit (HOSPITAL_COMMUNITY): Payer: Self-pay

## 2023-01-17 DIAGNOSIS — E6609 Other obesity due to excess calories: Secondary | ICD-10-CM

## 2023-01-17 DIAGNOSIS — E782 Mixed hyperlipidemia: Secondary | ICD-10-CM

## 2023-01-17 MED ORDER — WEGOVY 2.4 MG/0.75ML ~~LOC~~ SOAJ: 2.4000 mg | SUBCUTANEOUS | 2 refills | Status: DC

## 2023-01-17 MED ORDER — HAILEY 24 FE 1-20 MG-MCG(24) PO TABS
1.0000 | ORAL_TABLET | Freq: Every day | ORAL | 1 refills | Status: DC
  Filled 2023-01-17: qty 28, 28d supply, fill #0

## 2023-01-19 ENCOUNTER — Encounter: Payer: BC Managed Care – PPO | Admitting: Nurse Practitioner

## 2023-01-24 NOTE — Telephone Encounter (Signed)
[id number] Approved- scanned into Media

## 2023-01-25 DIAGNOSIS — N6459 Other signs and symptoms in breast: Secondary | ICD-10-CM | POA: Diagnosis not present

## 2023-01-25 DIAGNOSIS — N926 Irregular menstruation, unspecified: Secondary | ICD-10-CM | POA: Diagnosis not present

## 2023-02-02 ENCOUNTER — Encounter: Payer: Self-pay | Admitting: Nurse Practitioner

## 2023-02-02 ENCOUNTER — Ambulatory Visit: Payer: BC Managed Care – PPO | Admitting: Nurse Practitioner

## 2023-02-02 VITALS — BP 104/70 | HR 100 | Temp 98.6°F | Ht 59.5 in | Wt 156.6 lb

## 2023-02-02 DIAGNOSIS — J020 Streptococcal pharyngitis: Secondary | ICD-10-CM

## 2023-02-02 DIAGNOSIS — J029 Acute pharyngitis, unspecified: Secondary | ICD-10-CM | POA: Diagnosis not present

## 2023-02-02 LAB — POCT RAPID STREP A (OFFICE): Rapid Strep A Screen: POSITIVE — AB

## 2023-02-02 MED ORDER — NYSTATIN 100000 UNIT/ML MT SUSP: 5.0000 mL | Freq: Four times a day (QID) | OROMUCOSAL | 0 refills | Status: DC | PRN

## 2023-02-02 MED ORDER — FLUCONAZOLE 150 MG PO TABS: ORAL_TABLET | ORAL | 0 refills | Status: DC

## 2023-02-02 MED ORDER — AMOXICILLIN 500 MG PO CAPS: 500.0000 mg | ORAL_CAPSULE | Freq: Two times a day (BID) | ORAL | 0 refills | Status: AC

## 2023-02-02 NOTE — Progress Notes (Unsigned)
   Acute Office Visit  Subjective:     Patient ID: Andrea [name], female    DOB: 1986/04/26, 37 y.o.   MRN: [id number]  Chief Complaint  Patient presents with   Sore Throat    Fever, body aches, hurts to swallow for 3 days    HPI Patient is in today for sore throat and fever for 3 days.  UPPER RESPIRATORY TRACT INFECTION  Fever: yes Cough: no Shortness of breath: {Blank single:19197::"yes","no"} Wheezing: {Blank single:19197::"yes","no"} Chest pain: {Blank single:19197::"yes","no","yes, with cough"} Chest tightness: {Blank single:19197::"yes","no"} Chest congestion: {Blank single:19197::"yes","no"} Nasal congestion: {Blank single:19197::"yes","no"} Runny nose: {Blank single:19197::"yes","no"} Post nasal drip: {Blank single:19197::"yes","no"} Sneezing: {Blank single:19197::"yes","no"} Sore throat: {Blank single:19197::"yes","no"} Swollen glands: {Blank single:19197::"yes","no"} Sinus pressure: {Blank single:19197::"yes","no"} Headache: {Blank single:19197::"yes","no"} Face pain: {Blank single:19197::"yes","no"} Toothache: {Blank single:19197::"yes","no"} Ear pain: {Blank single:19197::"yes","no"} {Blank single:19197::""right","left", "bilateral"} Ear pressure: {Blank single:19197::"yes","no"} {Blank single:19197::""right","left", "bilateral"} Eyes red/itching:{Blank single:19197::"yes","no"} Eye drainage/crusting: {Blank single:19197::"yes","no"}  Vomiting: {Blank single:19197::"yes","no"} Rash: {Blank single:19197::"yes","no"} Fatigue: {Blank single:19197::"yes","no"} Sick contacts: no Strep contacts: yes  Context: {Blank multiple:19196::"better","worse","stable","fluctuating"} Recurrent sinusitis: {Blank single:19197::"yes","no"} Relief with OTC cold/cough medications: {Blank single:19197::"yes","no"}  Treatments attempted: tylenol   ROS See pertinent positives and negatives per HPI.     Objective:    BP 104/70 (BP Location: Right Arm)   Pulse 100   Temp 98.6  F (37 C)   Ht 4' 11.5" (1.511 m)   Wt 156 lb 9.6 oz (71 kg)   SpO2 99%   BMI 31.10 kg/m  {Vitals History (Optional):23777}  Physical Exam  No results found for any visits on 02/02/23.      Assessment & Plan:   Problem List Items Addressed This Visit   None   No orders of the defined types were placed in this encounter.   No follow-ups on file.  Charyl Dancer, NP

## 2023-02-02 NOTE — Patient Instructions (Signed)
It was great to see you!  Start amoxcillin twice a  day for 10 days.   I have sent diflucan in to take 1 tablet after finishing your antibiotics to prevent a yeast infection. You can take a second tablet 3 days later if needed.   Keep taking tylenol or ibuprofen to help with the sore throat.   Let's follow-up if your symptoms worsen or don't improve.   Take care,  Vance Peper, NP

## 2023-03-06 ENCOUNTER — Ambulatory Visit: Payer: BC Managed Care – PPO | Admitting: Nurse Practitioner

## 2023-03-12 ENCOUNTER — Ambulatory Visit: Payer: BC Managed Care – PPO | Admitting: Nurse Practitioner

## 2023-03-12 ENCOUNTER — Encounter: Payer: Self-pay | Admitting: Nurse Practitioner

## 2023-03-12 VITALS — BP 106/68 | HR 82 | Temp 98.1°F | Resp 16 | Ht 59.0 in | Wt 154.2 lb

## 2023-03-12 DIAGNOSIS — E6609 Other obesity due to excess calories: Secondary | ICD-10-CM | POA: Diagnosis not present

## 2023-03-12 DIAGNOSIS — E782 Mixed hyperlipidemia: Secondary | ICD-10-CM

## 2023-03-12 DIAGNOSIS — Z6831 Body mass index (BMI) 31.0-31.9, adult: Secondary | ICD-10-CM | POA: Diagnosis not present

## 2023-03-12 LAB — LIPID PANEL
Cholesterol: 187 mg/dL (ref 0–200)
HDL: 46.7 mg/dL (ref >39.00)
LDL Cholesterol: 123 mg/dL — ABNORMAL HIGH (ref 0–99)
NonHDL: 140.73
Total CHOL/HDL Ratio: 4
Triglycerides: 91 mg/dL (ref 0.0–149.0)
VLDL: 18.2 mg/dL (ref 0.0–40.0)

## 2023-03-12 NOTE — Progress Notes (Signed)
Established Patient Visit  Patient: Andrea [name]   DOB: 08/25/1986   37 y.o. Female  MRN: [id number] Visit Date: 03/12/2023  Subjective:    Chief Complaint  Patient presents with   Medical Management of Chronic Issues    Fasting - yes    HPI Obesity She stopped wegovy 2.4mg  25month ago due to acute viral illness and strep pharyngitis. She attempted to resume injection 2weeks ago, but she experience severe nausea and vomiting x 3days. She has not take injection for last 2weeks. She has maintain low carb and low fat diet Does not have consistent exercise regimen. Wt Readings from Last 3 Encounters:  03/12/23 154 lb 3.2 oz (69.9 kg)  02/02/23 156 lb 9.6 oz (71 kg)  12/05/22 159 lb (72.1 kg)    She opted not to resume injection at a lower dose at this time. She also declined referral to nutritionist. Advised to incorporate per week of moderate intensity exercise. F/up prn   Reviewed medical, surgical, and social history today  Medications: Outpatient Medications Prior to Visit  Medication Sig   EPINEPHrine 0.3 mg/0.3 mL IJ SOAJ injection Inject 0.3 mg into the muscle as needed for anaphylaxis.   HAILEY 24 FE 1-20 MG-MCG(24) tablet Take 1 tablet by mouth daily.   [DISCONTINUED] Semaglutide-Weight Management (WEGOVY) 2.4 MG/0.75ML SOAJ Inject 2.4 mg into the skin once a week.   [DISCONTINUED] fluconazole (DIFLUCAN) 150 MG tablet Take 1 tablet after finishing antibiotics and a second one 3 days later if needed (Patient not taking: Reported on 03/12/2023)   [DISCONTINUED] magic mouthwash (nystatin, lidocaine, diphenhydrAMINE, alum & mag hydroxide) suspension Swish and swallow 5 mLs 4 (four) times daily as needed for mouth pain. (Patient not taking: Reported on 03/12/2023)   No facility-administered medications prior to visit.   Reviewed past medical and social history.   ROS per HPI above      Objective:  BP 106/68 (BP Location: Left Arm, Patient Position:  Sitting, Cuff Size: Normal)   Pulse 82   Temp 98.1 F (36.7 C) (Temporal)   Resp 16   Ht 4\' 11"  (1.499 m)   Wt 154 lb 3.2 oz (69.9 kg)   SpO2 99%   BMI 31.14 kg/m      Physical Exam Vitals and nursing note reviewed.  Cardiovascular:     Rate and Rhythm: Normal rate.     Pulses: Normal pulses.  Pulmonary:     Effort: Pulmonary effort is normal.  Neurological:     Mental Status: [name] alert and oriented to person, place, and time.     No results found for any visits on 03/12/23.    Assessment & Plan:    Problem List Items Addressed This Visit       Other   Mixed hyperlipidemia   Relevant Orders   Lipid panel   Obesity - Primary    She stopped wegovy 2.4mg  51month ago due to acute viral illness and strep pharyngitis. She attempted to resume injection 2weeks ago, but she experience severe nausea and vomiting x 3days. She has not take injection for last 2weeks. She has maintain low carb and low fat diet Does not have consistent exercise regimen. Wt Readings from Last 3 Encounters:  03/12/23 154 lb 3.2 oz (69.9 kg)  02/02/23 156 lb 9.6 oz (71 kg)  12/05/22 159 lb (72.1 kg)    She opted not to resume injection at  a lower dose at this time. She also declined referral to nutritionist. Advised to incorporate per week of moderate intensity exercise. F/up prn      Relevant Orders   Lipid panel   Return in about 6 months (around 09/11/2023) for Weight management, hyperlipidemia (fasting).     Alysia Penna, NP

## 2023-03-12 NOTE — Assessment & Plan Note (Signed)
She stopped wegovy 2.4mg  51month ago due to acute viral illness and strep pharyngitis. She attempted to resume injection 2weeks ago, but she experience severe nausea and vomiting x 3days. She has not take injection for last 2weeks. She has maintain low carb and low fat diet Does not have consistent exercise regimen. Wt Readings from Last 3 Encounters:  03/12/23 154 lb 3.2 oz (69.9 kg)  02/02/23 156 lb 9.6 oz (71 kg)  12/05/22 159 lb (72.1 kg)    She opted not to resume injection at a lower dose at this time. She also declined referral to nutritionist. Advised to incorporate per week of moderate intensity exercise. F/up prn

## 2023-03-12 NOTE — Assessment & Plan Note (Signed)
Repeat lipid panel ?

## 2023-03-12 NOTE — Patient Instructions (Signed)
Go to lab Use myfitnesspal phone app to count daily caloric intake.  Calorie Counting for Weight Loss Calories are units of energy. Your body needs a certain number of calories from food to keep going throughout the day. When you eat or drink more calories than your body needs, your body stores the extra calories mostly as fat. When you eat or drink fewer calories than your body needs, your body burns fat to get the energy it needs. Calorie counting means keeping track of how many calories you eat and drink each day. Calorie counting can be helpful if you need to lose weight. If you eat fewer calories than your body needs, you should lose weight. Ask your health care provider what a healthy weight is for you. For calorie counting to work, you will need to eat the right number of calories each day to lose a healthy amount of weight per week. A dietitian can help you figure out how many calories you need in a day and will suggest ways to reach your calorie goal. A healthy amount of weight to lose each week is usually 1-2 lb (0.5-0.9 kg). This usually means that your daily calorie intake should be reduced by 500-750 calories. Eating 1,200-1,500 calories a day can help most women lose weight. Eating 1,500-1,800 calories a day can help most men lose weight. What do I need to know about calorie counting? Work with your health care provider or dietitian to determine how many calories you should get each day. To meet your daily calorie goal, you will need to: Find out how many calories are in each food that you would like to eat. Try to do this before you eat. Decide how much of the food you plan to eat. Keep a food log. Do this by writing down what you ate and how many calories it had. To successfully lose weight, it is important to balance calorie counting with a healthy lifestyle that includes regular activity. Where do I find calorie information?  The number of calories in a food can be found on a  Nutrition Facts label. If a food does not have a Nutrition Facts label, try to look up the calories online or ask your dietitian for help. Remember that calories are listed per serving. If you choose to have more than one serving of a food, you will have to multiply the calories per serving by the number of servings you plan to eat. For example, the label on a package of bread might say that a serving size is 1 slice and that there are 90 calories in a serving. If you eat 1 slice, you will have eaten 90 calories. If you eat 2 slices, you will have eaten 180 calories. How do I keep a food log? After each time that you eat, record the following in your food log as soon as possible: What you ate. Be sure to include toppings, sauces, and other extras on the food. How much you ate. This can be measured in cups, ounces, or number of items. How many calories were in each food and drink. The total number of calories in the food you ate. Keep your food log near you, such as in a pocket-sized notebook or on an app or website on your mobile phone. Some programs will calculate calories for you and show you how many calories you have left to meet your daily goal. What are some portion-control tips? Know how many calories are in a serving. This  will help you know how many servings you can have of a certain food. Use a measuring cup to measure serving sizes. You could also try weighing out portions on a kitchen scale. With time, you will be able to estimate serving sizes for some foods. Take time to put servings of different foods on your favorite plates or in your favorite bowls and cups so you know what a serving looks like. Try not to eat straight from a food's packaging, such as from a bag or box. Eating straight from the package makes it hard to see how much you are eating and can lead to overeating. Put the amount you would like to eat in a cup or on a plate to make sure you are eating the right portion. Use  smaller plates, glasses, and bowls for smaller portions and to prevent overeating. Try not to multitask. For example, avoid watching TV or using your computer while eating. If it is time to eat, sit down at a table and enjoy your food. This will help you recognize when you are full. It will also help you be more mindful of what and how much you are eating. What are tips for following this plan? Reading food labels Check the calorie count compared with the serving size. The serving size may be smaller than what you are used to eating. Check the source of the calories. Try to choose foods that are high in protein, fiber, and vitamins, and low in saturated fat, trans fat, and sodium. Shopping Read nutrition labels while you shop. This will help you make healthy decisions about which foods to buy. Pay attention to nutrition labels for low-fat or fat-free foods. These foods sometimes have the same number of calories or more calories than the full-fat versions. They also often have added sugar, starch, or salt to make up for flavor that was removed with the fat. Make a grocery list of lower-calorie foods and stick to it. Cooking Try to cook your favorite foods in a healthier way. For example, try baking instead of frying. Use low-fat dairy products. Meal planning Use more fruits and vegetables. One-half of your plate should be fruits and vegetables. Include lean proteins, such as chicken, [place], and fish. Lifestyle Each week, aim to do one of the following: 150 minutes of moderate exercise, such as walking. 75 minutes of vigorous exercise, such as running. General information Know how many calories are in the foods you eat most often. This will help you calculate calorie counts faster. Find a way of tracking calories that works for you. Get creative. Try different apps or programs if writing down calories does not work for you. What foods should I eat?  Eat nutritious foods. It is better to have  a nutritious, high-calorie food, such as an avocado, than a food with few nutrients, such as a bag of potato chips. Use your calories on foods and drinks that will fill you up and will not leave you hungry soon after eating. Examples of foods that fill you up are nuts and nut butters, vegetables, lean proteins, and high-fiber foods such as whole grains. High-fiber foods are foods with more than 5 g of fiber per serving. Pay attention to calories in drinks. Low-calorie drinks include water and unsweetened drinks. The items listed above may not be a complete list of foods and beverages you can eat. Contact a dietitian for more information. What foods should I limit? Limit foods or drinks that are not good sources  of vitamins, minerals, or protein or that are high in unhealthy fats. These include: Candy. Other sweets. Sodas, specialty coffee drinks, alcohol, and juice. The items listed above may not be a complete list of foods and beverages you should avoid. Contact a dietitian for more information. How do I count calories when eating out? Pay attention to portions. Often, portions are much larger when eating out. Try these tips to keep portions smaller: Consider sharing a meal instead of getting your own. If you get your own meal, eat only half of it. Before you start eating, ask for a container and put half of your meal into it. When available, consider ordering smaller portions from the menu instead of full portions. Pay attention to your food and drink choices. Knowing the way food is cooked and what is included with the meal can help you eat fewer calories. If calories are listed on the menu, choose the lower-calorie options. Choose dishes that include vegetables, fruits, whole grains, low-fat dairy products, and lean proteins. Choose items that are boiled, broiled, grilled, or steamed. Avoid items that are buttered, battered, fried, or served with cream sauce. Items labeled as crispy are  usually fried, unless stated otherwise. Choose water, low-fat milk, unsweetened iced tea, or other drinks without added sugar. If you want an alcoholic beverage, choose a lower-calorie option, such as a glass of wine or light beer. Ask for dressings, sauces, and syrups on the side. These are usually high in calories, so you should limit the amount you eat. If you want a salad, choose a garden salad and ask for grilled meats. Avoid extra toppings such as bacon, cheese, or fried items. Ask for the dressing on the side, or ask for olive oil and vinegar or lemon to use as dressing. Estimate how many servings of a food you are given. Knowing serving sizes will help you be aware of how much food you are eating at restaurants. Where to find more information Centers for Disease Control and Prevention: FootballExhibition.com.br U.S. Department of Agriculture: WrestlingReporter.dk Summary Calorie counting means keeping track of how many calories you eat and drink each day. If you eat fewer calories than your body needs, you should lose weight. A healthy amount of weight to lose per week is usually 1-2 lb (0.5-0.9 kg). This usually means reducing your daily calorie intake by 500-750 calories. The number of calories in a food can be found on a Nutrition Facts label. If a food does not have a Nutrition Facts label, try to look up the calories online or ask your dietitian for help. Use smaller plates, glasses, and bowls for smaller portions and to prevent overeating. Use your calories on foods and drinks that will fill you up and not leave you hungry shortly after a meal. This information is not intended to replace advice given to you by your health care provider. Make sure you discuss any questions you have with your health care provider. Document Revised: 12/25/2019 Document Reviewed: 12/25/2019 Elsevier Patient Education  2023 Elsevier Inc.   How to Increase Your Level of Physical Activity Getting regular physical activity is  important for your overall health and well-being. Most people do not get enough exercise. There are easy ways to increase your level of physical activity, even if you have not been very active in the past or if you are just starting out. What are the benefits of physical activity? Physical activity has many short-term and long-term benefits. Being active on a regular basis can  improve your physical and mental health as well as provide other benefits. Physical health benefits Helping you lose weight or maintain a healthy weight. Strengthening your muscles and bones. Reducing your risk of certain long-term (chronic) diseases, including heart disease, cancer, and diabetes. Being able to move around more easily and for longer periods of time without getting tired (increased endurance or stamina). Improving your ability to fight off illness (enhanced immunity). Being able to sleep better. Helping you stay healthy as you get older, including: Helping you stay mobile, or capable of walking and moving around. Preventing accidents, such as falls. Increasing life expectancy. Mental health benefits Boosting your mood and improving your self-esteem. Lowering your chance of having mental health problems, such as depression or anxiety. Helping you feel good about your body. Other benefits Finding new sources of fun and enjoyment. Meeting new people who share a common interest. Before you begin If you have a chronic illness or have not been active for a while, check with your health care provider about how to get started. Ask your health care provider what activities are safe for you. Start out slowly. Walking or doing some simple chair exercises is a good place to start, especially if you have not been active before or for a long time. Set goals that you can work toward. Ask your health care provider how much exercise is best for you. In general, most adults should: Do moderate-intensity exercise for at  least 150 minutes each week (30 minutes on most days of the week) or vigorous exercise for at least 75 minutes each week, or a combination of these. Moderate-intensity exercise can include walking at a quick pace, biking, yoga, water aerobics, or gardening. Vigorous exercise involves activities that take more effort, such as jogging or running, playing sports, swimming laps, or jumping rope. Do strength exercises on at least 2 days each week. This can include weight lifting, body weight exercises, and resistance-band exercises. How to be more physically active Make a plan  Try to find activities that you enjoy. You are more likely to commit to an exercise routine if it does not feel like a chore. If you have bone or joint problems, choose low-impact exercises, like walking or swimming. Use these tips for being successful with an exercise plan: Find a workout partner for accountability. Join a group or class, such as an aerobics class, cycling class, or sports team. Make family time active. Go for a walk, bike, or swim. Include a variety of exercises each week. Consider using a fitness tracker, such as a mobile phone app or a device worn like a watch, that will count the number of steps you take each day. Many people strive to reach 10,000 steps a day. Find ways to be active in your daily routines Besides your formal exercise plans, you can find ways to do physical activity during your daily routines, such as: Walking or biking to work or to the store. Taking the stairs instead of the elevator. Parking farther away from the door at work or at the store. Planning walking meetings. Walking around while you are on the phone. Where to find more information Centers for Disease Control and Prevention: CampusCasting.com.pt President's Council on Fitness, Sports & Nutrition: www.fitness.gov ChooseMyPlate: http://www.harvey.com/ Contact a health care provider if: You have headaches, muscle aches,  or joint pain that is concerning. You feel dizzy or light-headed while exercising. You faint. You feel your heart skipping, racing, or fluttering. You have chest pain while exercising.  Summary Exercise benefits your mind and body at any age, even if you are just starting out. If you have a chronic illness or have not been active for a while, check with your health care provider before increasing your physical activity. Choose activities that are safe and enjoyable for you. Ask your health care provider what activities are safe for you. Start slowly. Tell your health care provider if you have problems as you start to increase your activity level. This information is not intended to replace advice given to you by your health care provider. Make sure you discuss any questions you have with your health care provider. Document Revised: 03/11/2021 Document Reviewed: 03/11/2021 Elsevier Patient Education  2023 [employer].

## 2023-03-13 NOTE — Progress Notes (Signed)
Persistent mildly elevated LDL Make lifestyle modifications as discussed

## 2023-04-09 ENCOUNTER — Telehealth: Payer: Self-pay

## 2023-04-09 NOTE — Telephone Encounter (Signed)
Patient is approx [redacted] weeks along complaining of abdominal pain. Patient denies any bleeding. Patient requests to be seen sooner than her new OB appt. Explained I dont have any availability but she is more than welcome to go to Maternity admissions unit at [facility] (entrance C) for evaluation. Patient states understanding. Armandina Stammer RN

## 2023-04-11 DIAGNOSIS — O0900 Supervision of pregnancy with history of infertility, unspecified trimester: Secondary | ICD-10-CM | POA: Diagnosis not present

## 2023-04-11 DIAGNOSIS — R102 Pelvic and perineal pain: Secondary | ICD-10-CM | POA: Diagnosis not present

## 2023-04-11 DIAGNOSIS — Z8759 Personal history of other complications of pregnancy, childbirth and the puerperium: Secondary | ICD-10-CM | POA: Diagnosis not present

## 2023-04-11 DIAGNOSIS — Z1331 Encounter for screening for depression: Secondary | ICD-10-CM | POA: Diagnosis not present

## 2023-04-11 DIAGNOSIS — O418X10 Other specified disorders of amniotic fluid and membranes, first trimester, not applicable or unspecified: Secondary | ICD-10-CM | POA: Diagnosis not present

## 2023-04-11 DIAGNOSIS — Z3201 Encounter for pregnancy test, result positive: Secondary | ICD-10-CM | POA: Diagnosis not present

## 2023-04-11 DIAGNOSIS — O26891 Other specified pregnancy related conditions, first trimester: Secondary | ICD-10-CM | POA: Diagnosis not present

## 2023-04-19 DIAGNOSIS — O468X1 Other antepartum hemorrhage, first trimester: Secondary | ICD-10-CM | POA: Diagnosis not present

## 2023-04-19 DIAGNOSIS — O26849 Uterine size-date discrepancy, unspecified trimester: Secondary | ICD-10-CM | POA: Diagnosis not present

## 2023-04-19 DIAGNOSIS — O208 Other hemorrhage in early pregnancy: Secondary | ICD-10-CM | POA: Diagnosis not present

## 2023-04-19 DIAGNOSIS — Z3491 Encounter for supervision of normal pregnancy, unspecified, first trimester: Secondary | ICD-10-CM | POA: Diagnosis not present

## 2023-04-19 DIAGNOSIS — O26891 Other specified pregnancy related conditions, first trimester: Secondary | ICD-10-CM | POA: Diagnosis not present

## 2023-04-19 DIAGNOSIS — N83201 Unspecified ovarian cyst, right side: Secondary | ICD-10-CM | POA: Diagnosis not present

## 2023-04-19 DIAGNOSIS — O418X10 Other specified disorders of amniotic fluid and membranes, first trimester, not applicable or unspecified: Secondary | ICD-10-CM | POA: Diagnosis not present

## 2023-05-09 ENCOUNTER — Encounter: Payer: BC Managed Care – PPO | Admitting: Family Medicine

## 2023-05-23 ENCOUNTER — Telehealth: Payer: Self-pay | Admitting: *Deleted

## 2023-05-23 DIAGNOSIS — Z3482 Encounter for supervision of other normal pregnancy, second trimester: Secondary | ICD-10-CM | POA: Diagnosis not present

## 2023-05-23 DIAGNOSIS — Z113 Encounter for screening for infections with a predominantly sexual mode of transmission: Secondary | ICD-10-CM | POA: Diagnosis not present

## 2023-05-23 NOTE — Telephone Encounter (Signed)
Returned call from patient to reschedule New OB appointment on 05/24/2023. Left patient an urgent message that the first available is 06/28/2023 with Dr. Macon Large at 1:10 with an 1:00 PM arrival time as of this time today. Subject to change as patients call to schedule.

## 2023-05-24 ENCOUNTER — Encounter: Payer: BC Managed Care – PPO | Admitting: Family Medicine

## 2023-07-02 DIAGNOSIS — Z3689 Encounter for other specified antenatal screening: Secondary | ICD-10-CM | POA: Diagnosis not present

## 2023-07-02 DIAGNOSIS — Z3481 Encounter for supervision of other normal pregnancy, first trimester: Secondary | ICD-10-CM | POA: Diagnosis not present

## 2023-07-09 ENCOUNTER — Encounter: Payer: Self-pay | Admitting: Nurse Practitioner

## 2023-07-09 ENCOUNTER — Ambulatory Visit: Payer: BC Managed Care – PPO | Admitting: Nurse Practitioner

## 2023-07-09 VITALS — BP 112/62 | HR 92 | Temp 98.8°F | Resp 16 | Ht 59.0 in | Wt 176.0 lb

## 2023-07-09 DIAGNOSIS — U071 COVID-19: Secondary | ICD-10-CM

## 2023-07-09 DIAGNOSIS — H6993 Unspecified Eustachian tube disorder, bilateral: Secondary | ICD-10-CM

## 2023-07-09 DIAGNOSIS — Z3A19 19 weeks gestation of pregnancy: Secondary | ICD-10-CM

## 2023-07-09 LAB — POC COVID19 BINAXNOW: SARS Coronavirus 2 Ag: POSITIVE — AB

## 2023-07-09 MED ORDER — CETIRIZINE HCL 10 MG PO TABS
10.0000 mg | ORAL_TABLET | Freq: Every day | ORAL | 0 refills | Status: DC
Start: 2023-07-09 — End: 2024-01-30

## 2023-07-09 MED ORDER — NIRMATRELVIR/RITONAVIR (PAXLOVID)TABLET: 3.0000 | ORAL_TABLET | Freq: Two times a day (BID) | ORAL | 0 refills | Status: AC

## 2023-07-09 MED ORDER — CETIRIZINE HCL 10 MG PO TABS
10.0000 mg | ORAL_TABLET | Freq: Every day | ORAL | 0 refills | Status: DC
Start: 2023-07-09 — End: 2023-07-09

## 2023-07-09 MED ORDER — FLUTICASONE PROPIONATE 50 MCG/ACT NA SUSP
2.0000 | Freq: Every day | NASAL | 0 refills | Status: DC
Start: 2023-07-09 — End: 2024-01-30

## 2023-07-09 NOTE — Progress Notes (Signed)
Acute Office Visit  Subjective:    Patient ID: Andrea [name], female    DOB: 03-09-1986, 37 y.o.   MRN: [id number]  Chief Complaint  Patient presents with   URI    Sore throat, runny nose, headache and body aches     URI  This is a new problem. The current episode started in the past 7 days (4days ago). There has been no fever. Associated symptoms include congestion, ear pain, joint pain and sinus pain. Pertinent negatives include no abdominal pain, chest pain, coughing, diarrhea, dysuria, headaches, joint swelling, nausea, neck pain, plugged ear sensation, rash, rhinorrhea, sneezing, sore throat, swollen glands, vomiting or wheezing. She has tried acetaminophen for the symptoms.   Outpatient Medications Prior to Visit  Medication Sig   EPINEPHrine 0.3 mg/0.3 mL IJ SOAJ injection Inject 0.3 mg into the muscle as needed for anaphylaxis. (Patient not taking: Reported on 07/09/2023)   [DISCONTINUED] HAILEY 24 FE 1-20 MG-MCG(24) tablet Take 1 tablet by mouth daily. (Patient not taking: Reported on 07/09/2023)   No facility-administered medications prior to visit.    Reviewed past medical and social history.  Review of Systems  HENT:  Positive for congestion, ear pain and sinus pain. Negative for rhinorrhea, sneezing and sore throat.   Respiratory:  Negative for cough and wheezing.   Cardiovascular:  Negative for chest pain.  Gastrointestinal:  Negative for abdominal pain, diarrhea, nausea and vomiting.  Genitourinary:  Negative for dysuria.  Musculoskeletal:  Positive for joint pain. Negative for neck pain.  Skin:  Negative for rash.  Neurological:  Negative for headaches.   Per HPI     Objective:    Physical Exam Vitals and nursing note reviewed.  HENT:     Right Ear: Ear canal and external ear normal. No swelling or tenderness. A middle ear effusion is present. There is no impacted cerumen. No mastoid tenderness. Tympanic membrane is injected. Tympanic membrane is not scarred,  perforated, erythematous, retracted or bulging.     Left Ear: Ear canal and external ear normal. No swelling or tenderness. A middle ear effusion is present. There is no impacted cerumen. No mastoid tenderness. Tympanic membrane is injected. Tympanic membrane is not scarred, perforated, erythematous, retracted or bulging.  Cardiovascular:     Rate and Rhythm: Normal rate and regular rhythm.     Pulses: Normal pulses.     Heart sounds: Normal heart sounds.  Pulmonary:     Effort: Pulmonary effort is normal.     Breath sounds: Normal breath sounds.  Musculoskeletal:     Right lower leg: No edema.     Left lower leg: No edema.  Neurological:     Mental Status: [name] alert and oriented to person, place, and time.    BP 112/62 (BP Location: Left Arm, Patient Position: Sitting, Cuff Size: Normal)   Pulse 92   Temp 98.8 F (37.1 C) (Oral)   Resp 16   Ht 4\' 11"  (1.499 m)   Wt 176 lb (79.8 kg)   LMP 12/03/2022 (Exact Date)   SpO2 99%   BMI 35.55 kg/m    Results for orders placed or performed in visit on 07/09/23  POC COVID-19 BinaxNow  Result Value Ref Range   SARS Coronavirus 2 Ag Positive (A) Negative      Assessment & Plan:   Problem List Items Addressed This Visit   None Visit Diagnoses     COVID-19    -  Primary   Relevant Orders  POC COVID-19 BinaxNow (Completed)   [redacted] weeks gestation of pregnancy         We discussed pros vs cons of paxlovid. She describes symptoms as moderate with ear pain being the worse symptoms. She opted to take paxlovid prescription. Advised to also f/up with OB. Provided ED precautions  No orders of the defined types were placed in this encounter.  No follow-ups on file.    Alysia Penna, NP

## 2023-07-09 NOTE — Patient Instructions (Signed)
F/up with OB as instructed Go to ED if symptoms worsen  COVID-19 COVID-19 is an infection caused by a virus called SARS-CoV-2. This type of virus is called a coronavirus. People with COVID-19 may: Have little to no symptoms. Have mild to moderate symptoms that affect their lungs and breathing. Get very sick. What are the causes? COVID-19 is caused by a virus. This virus may be in the air as droplets or on surfaces. It can spread from an infected person when they cough, sneeze, speak, sing, or breathe. You may become infected if: You breathe in the infected droplets in the air. You touch an object that has the virus on it. What increases the risk? You are at risk of getting COVID-19 if you have been around someone with the infection. You may be more likely to get very sick if: You are 36 years old or older. You have certain medical conditions, such as: Heart disease. Diabetes. Chronic respiratory disease. Cancer. Pregnancy. You are immunocompromised. This means your body cannot fight infections easily. You have a disability or trouble moving, meaning you're immobile. What are the signs or symptoms? People may have different symptoms from COVID-19. The symptoms can also be mild to severe. They often show up in 5-6 days after being infected. But they can take up to 14 days to appear. Common symptoms are: Cough. Feeling tired. New loss of taste or smell. Fever. Less common symptoms are: Sore throat. Headache. Body or muscle aches. Diarrhea. A skin rash or odd-colored fingers or toes. Red or irritated eyes. Sometimes, COVID-19 does not cause symptoms. How is this diagnosed? COVID-19 can be diagnosed with tests done in the lab or at home. Fluid from your nose, mouth, or lungs will be used to check for the virus. How is this treated? Treatment for COVID-19 depends on how sick you are. Mild symptoms can be treated at home with rest, fluids, and over-the-counter medicines. Severe  symptoms may be treated in a hospital intensive care unit (ICU). If you have symptoms and are at risk of getting very sick, you may be given a medicine that fights viruses. This medicine is called an antiviral. How is this prevented? To protect yourself from COVID-19: Know your risk factors. Get vaccinated. If your body cannot fight infections easily, talk to your provider about treatment to help prevent COVID-19. Stay at least 1 meter away from others. Wear a well-fitted mask when: You can't stay at a distance from people. You're in a place with poor air flow. Try to be in open spaces with good air flow when in public. Wash your hands often or use an alcohol-based hand sanitizer. Cover your nose and mouth when coughing and sneezing. If you think you have COVID-19 or have been around someone who has it, stay home and be by yourself for 5-10 days. Where to find more information Centers for Disease Control and Prevention (CDC): TonerPromos.no World Health Organization [facility]): VisitDestination.com.br Get help right away if: You have trouble breathing or get short of breath. You have pain or pressure in your chest. You cannot speak or move any part of your body. You are confused. Your symptoms get worse. These symptoms may be an emergency. Get help right away. Call 911. Do not wait to see if the symptoms will go away. Do not drive yourself to the hospital. This information is not intended to replace advice given to you by your health care provider. Make sure you discuss any questions you have with your health  care provider. Document Revised: 11/21/2022 Document Reviewed: 07/28/2022 Elsevier Patient Education  2024 [employer].

## 2023-08-06 DIAGNOSIS — Z3481 Encounter for supervision of other normal pregnancy, first trimester: Secondary | ICD-10-CM | POA: Diagnosis not present

## 2023-08-06 DIAGNOSIS — Z362 Encounter for other antenatal screening follow-up: Secondary | ICD-10-CM | POA: Diagnosis not present

## 2023-08-06 DIAGNOSIS — Z3689 Encounter for other specified antenatal screening: Secondary | ICD-10-CM | POA: Diagnosis not present

## 2023-08-21 DIAGNOSIS — R197 Diarrhea, unspecified: Secondary | ICD-10-CM | POA: Diagnosis not present

## 2023-08-21 DIAGNOSIS — Z3482 Encounter for supervision of other normal pregnancy, second trimester: Secondary | ICD-10-CM | POA: Diagnosis not present

## 2023-08-21 DIAGNOSIS — R112 Nausea with vomiting, unspecified: Secondary | ICD-10-CM | POA: Diagnosis not present

## 2023-09-03 DIAGNOSIS — U071 COVID-19: Secondary | ICD-10-CM | POA: Diagnosis not present

## 2023-09-03 DIAGNOSIS — O98512 Other viral diseases complicating pregnancy, second trimester: Secondary | ICD-10-CM | POA: Diagnosis not present

## 2023-09-03 DIAGNOSIS — Z3482 Encounter for supervision of other normal pregnancy, second trimester: Secondary | ICD-10-CM | POA: Diagnosis not present

## 2023-09-10 ENCOUNTER — Ambulatory Visit: Payer: BC Managed Care – PPO | Admitting: Nurse Practitioner

## 2023-09-20 DIAGNOSIS — Z3490 Encounter for supervision of normal pregnancy, unspecified, unspecified trimester: Secondary | ICD-10-CM | POA: Diagnosis not present

## 2023-09-20 DIAGNOSIS — U071 COVID-19: Secondary | ICD-10-CM | POA: Diagnosis not present

## 2023-09-20 DIAGNOSIS — O98513 Other viral diseases complicating pregnancy, third trimester: Secondary | ICD-10-CM | POA: Diagnosis not present

## 2023-09-21 ENCOUNTER — Encounter: Payer: Self-pay | Admitting: Nurse Practitioner

## 2023-09-21 ENCOUNTER — Ambulatory Visit: Payer: BC Managed Care – PPO | Admitting: Nurse Practitioner

## 2023-09-21 VITALS — BP 99/65 | HR 96 | Temp 98.6°F | Resp 18 | Wt 191.2 lb

## 2023-09-21 DIAGNOSIS — J069 Acute upper respiratory infection, unspecified: Secondary | ICD-10-CM | POA: Diagnosis not present

## 2023-09-21 DIAGNOSIS — J039 Acute tonsillitis, unspecified: Secondary | ICD-10-CM

## 2023-09-21 LAB — POC COVID19 BINAXNOW: SARS Coronavirus 2 Ag: NEGATIVE

## 2023-09-21 LAB — POCT RAPID STREP A (OFFICE): Rapid Strep A Screen: NEGATIVE

## 2023-09-21 NOTE — Patient Instructions (Addendum)
Ok to use tylenol OVER THE COUNTER as needed for pain Use warm salt water gaggle and warm liquids to soothe throat. Use flonase nasal spray for nasal congestion. Use Robitussin DIABETES for cough. Maintain adequate oral hydration

## 2023-09-21 NOTE — Progress Notes (Signed)
Acute Office Visit  Subjective:    Patient ID: Andrea [name], female    DOB: 1986/01/17, 37 y.o.   MRN: [id number]  Chief Complaint  Patient presents with   Acute Visit    PT is here for sore throat for 3 days. No fever, mild cough with pain while swallowing. PT took OTC tylenol but voiced it hasn't helped much.     URI  This is a new problem. The current episode started in the past 7 days. The problem has been unchanged. There has been no fever. Associated symptoms include congestion and rhinorrhea. Pertinent negatives include no abdominal pain, chest pain, coughing, diarrhea, dysuria, ear pain, headaches, joint pain, joint swelling, nausea, neck pain, plugged ear sensation, rash, sinus pain, sneezing, sore throat, swollen glands, vomiting or wheezing. She has tried acetaminophen for the symptoms. The treatment provided mild relief.  Denies any known sick contact Outpatient Medications Prior to Visit  Medication Sig   EPINEPHrine 0.3 mg/0.3 mL IJ SOAJ injection Inject 0.3 mg into the muscle as needed for anaphylaxis.   cetirizine (ZYRTEC) 10 MG tablet Take 1 tablet (10 mg total) by mouth daily. (Patient not taking: Reported on 09/21/2023)   fluticasone (FLONASE) 50 MCG/ACT nasal spray Place 2 sprays into both nostrils daily. (Patient not taking: Reported on 09/21/2023)   No facility-administered medications prior to visit.   Reviewed past medical and social history.  Review of Systems  HENT:  Positive for congestion and rhinorrhea. Negative for ear pain, sinus pain, sneezing and sore throat.   Respiratory:  Negative for cough and wheezing.   Cardiovascular:  Negative for chest pain.  Gastrointestinal:  Negative for abdominal pain, diarrhea, nausea and vomiting.  Genitourinary:  Negative for dysuria.  Musculoskeletal:  Negative for joint pain and neck pain.  Skin:  Negative for rash.  Neurological:  Negative for headaches.   Per HPI     Objective:    Physical Exam Vitals and  nursing note reviewed.  Constitutional:      General: [name] not in acute distress. HENT:     Right Ear: Tympanic membrane, ear canal and external ear normal.     Left Ear: Tympanic membrane, ear canal and external ear normal.     Nose: Congestion present. No nasal tenderness, mucosal edema or rhinorrhea.     Right Nostril: No occlusion.     Left Nostril: No occlusion.     Right Turbinates: Not enlarged, swollen or pale.     Left Turbinates: Not enlarged, swollen or pale.     Right Sinus: No maxillary sinus tenderness or frontal sinus tenderness.     Left Sinus: No maxillary sinus tenderness or frontal sinus tenderness.     Mouth/Throat:     Pharynx: Uvula midline. Oropharyngeal exudate and posterior oropharyngeal erythema present. No uvula swelling.     Tonsils: No tonsillar exudate or tonsillar abscesses. 2+ on the right. 2+ on the left.  Eyes:     Extraocular Movements: Extraocular movements intact.     Conjunctiva/sclera: Conjunctivae normal.  Cardiovascular:     Rate and Rhythm: Normal rate and regular rhythm.     Pulses: Normal pulses.     Heart sounds: Normal heart sounds.  Pulmonary:     Effort: Pulmonary effort is normal.     Breath sounds: Normal breath sounds.  Musculoskeletal:     Cervical back: Normal range of motion and neck supple.  Lymphadenopathy:     Cervical: No cervical adenopathy.  Skin:  Findings: No rash.  Neurological:     Mental Status: [name] alert and oriented to person, place, and time.    BP 99/65 (BP Location: Right Arm, Patient Position: Sitting, Cuff Size: Large) Comment: recheck BP reading  Pulse 96   Temp 98.6 F (37 C) (Oral)   Resp 18   Wt 191 lb 3.2 oz (86.7 kg)   LMP 12/03/2022 (Exact Date)   SpO2 97%   BMI 38.62 kg/m    Results for orders placed or performed in visit on 09/21/23  POC COVID-19 BinaxNow  Result Value Ref Range   SARS Coronavirus 2 Ag Negative Negative  POCT rapid strep A  Result Value Ref Range   Rapid Strep  A Screen Negative Negative      Assessment & Plan:   Problem List Items Addressed This Visit   None Visit Diagnoses     Upper respiratory tract infection, unspecified type    -  Primary   Relevant Orders   POC COVID-19 BinaxNow (Completed)   POCT rapid strep A (Completed)   Acute tonsillitis, unspecified etiology       Relevant Orders   Culture, Group A Strep     Ok to use tylenol OVER THE COUNTER as needed for pain Use warm salt water gaggle and warm liquids to soothe throat. Use flonase nasal spray for nasal congestion. Use Robitussin DIABETES for cough. Maintain adequate oral hydration.  No orders of the defined types were placed in this encounter.  Return if symptoms worsen or fail to improve.  Alysia Penna, NP

## 2023-09-27 LAB — TIQ-NTM

## 2023-09-27 LAB — CULTURE, GROUP A STREP

## 2023-10-05 DIAGNOSIS — Z23 Encounter for immunization: Secondary | ICD-10-CM | POA: Diagnosis not present

## 2023-10-05 DIAGNOSIS — Z3490 Encounter for supervision of normal pregnancy, unspecified, unspecified trimester: Secondary | ICD-10-CM | POA: Diagnosis not present

## 2023-10-05 DIAGNOSIS — Z3482 Encounter for supervision of other normal pregnancy, second trimester: Secondary | ICD-10-CM | POA: Diagnosis not present

## 2023-10-29 DIAGNOSIS — M5489 Other dorsalgia: Secondary | ICD-10-CM | POA: Diagnosis not present

## 2023-11-05 DIAGNOSIS — Z3490 Encounter for supervision of normal pregnancy, unspecified, unspecified trimester: Secondary | ICD-10-CM | POA: Diagnosis not present

## 2023-11-05 DIAGNOSIS — O2603 Excessive weight gain in pregnancy, third trimester: Secondary | ICD-10-CM | POA: Diagnosis not present

## 2023-11-19 DIAGNOSIS — Z91018 Allergy to other foods: Secondary | ICD-10-CM | POA: Diagnosis not present

## 2023-11-19 DIAGNOSIS — O09523 Supervision of elderly multigravida, third trimester: Secondary | ICD-10-CM | POA: Diagnosis not present

## 2023-11-19 DIAGNOSIS — O99214 Obesity complicating childbirth: Secondary | ICD-10-CM | POA: Diagnosis not present

## 2023-11-19 DIAGNOSIS — O2603 Excessive weight gain in pregnancy, third trimester: Secondary | ICD-10-CM | POA: Diagnosis not present

## 2023-11-19 DIAGNOSIS — O99892 Other specified diseases and conditions complicating childbirth: Secondary | ICD-10-CM | POA: Diagnosis not present

## 2023-11-19 DIAGNOSIS — Z3A38 38 weeks gestation of pregnancy: Secondary | ICD-10-CM | POA: Diagnosis not present

## 2023-11-19 DIAGNOSIS — Z3483 Encounter for supervision of other normal pregnancy, third trimester: Secondary | ICD-10-CM | POA: Diagnosis not present

## 2023-11-19 DIAGNOSIS — Z8616 Personal history of COVID-19: Secondary | ICD-10-CM | POA: Diagnosis not present

## 2023-11-19 DIAGNOSIS — N858 Other specified noninflammatory disorders of uterus: Secondary | ICD-10-CM | POA: Diagnosis not present

## 2023-11-19 DIAGNOSIS — O4202 Full-term premature rupture of membranes, onset of labor within 24 hours of rupture: Secondary | ICD-10-CM | POA: Diagnosis not present

## 2023-12-10 ENCOUNTER — Ambulatory Visit: Payer: BC Managed Care – PPO | Admitting: Family Medicine

## 2024-01-01 ENCOUNTER — Encounter: Payer: BC Managed Care – PPO | Admitting: Nurse Practitioner

## 2024-01-21 DIAGNOSIS — Z1331 Encounter for screening for depression: Secondary | ICD-10-CM | POA: Diagnosis not present

## 2024-01-21 DIAGNOSIS — Z309 Encounter for contraceptive management, unspecified: Secondary | ICD-10-CM | POA: Diagnosis not present

## 2024-01-30 ENCOUNTER — Encounter: Payer: Self-pay | Admitting: Nurse Practitioner

## 2024-01-30 ENCOUNTER — Ambulatory Visit: Payer: BC Managed Care – PPO | Admitting: Nurse Practitioner

## 2024-01-30 VITALS — BP 110/74 | HR 78 | Temp 98.0°F | Ht 60.0 in | Wt 184.6 lb

## 2024-01-30 DIAGNOSIS — K644 Residual hemorrhoidal skin tags: Secondary | ICD-10-CM

## 2024-01-30 DIAGNOSIS — Z0001 Encounter for general adult medical examination with abnormal findings: Secondary | ICD-10-CM | POA: Diagnosis not present

## 2024-01-30 LAB — COMPREHENSIVE METABOLIC PANEL
ALT: 60 U/L — ABNORMAL HIGH (ref 0–35)
AST: 30 U/L (ref 0–37)
Albumin: 4.2 g/dL (ref 3.5–5.2)
Alkaline Phosphatase: 50 U/L (ref 39–117)
BUN: 16 mg/dL (ref 6–23)
CO2: 29 meq/L (ref 19–32)
Calcium: 9.5 mg/dL (ref 8.4–10.5)
Chloride: 102 meq/L (ref 96–112)
Creatinine, Ser: 0.63 mg/dL (ref 0.40–1.20)
GFR: 113.65 mL/min (ref >60.00)
Glucose, Bld: 87 mg/dL (ref 70–99)
Potassium: 4.1 meq/L (ref 3.5–5.1)
Sodium: 138 meq/L (ref 135–145)
Total Bilirubin: 0.6 mg/dL (ref 0.2–1.2)
Total Protein: 7.5 g/dL (ref 6.0–8.3)

## 2024-01-30 LAB — CBC
HCT: 43.4 % (ref 36.0–46.0)
Hemoglobin: 14.3 g/dL (ref 12.0–15.0)
MCHC: 33 g/dL (ref 30.0–36.0)
MCV: 86.5 fl (ref 78.0–100.0)
Platelets: 239 10*3/uL (ref 150.0–400.0)
RBC: 5.02 Mil/uL (ref 3.87–5.11)
RDW: 14.6 % (ref 11.5–15.5)
WBC: 4.4 10*3/uL (ref 4.0–10.5)

## 2024-01-30 LAB — TSH: TSH: 1 u[IU]/mL (ref 0.35–5.50)

## 2024-01-30 MED ORDER — HYDROCORTISONE ACETATE 25 MG RE SUPP: 25.0000 mg | Freq: Two times a day (BID) | RECTAL | 0 refills | Status: AC

## 2024-01-30 NOTE — Assessment & Plan Note (Signed)
 No improvement with OVER THE COUNTER topical agents. No constipation or diarrhea Worse with recent childbirth 2months ago, no rectal pain, has intermittent blood on stool.  Proctosol supp sent

## 2024-01-30 NOTE — Progress Notes (Signed)
 Complete physical exam  Patient: Andrea [name]   DOB: [birth year]   38 y.o. Female  MRN: [id number] Visit Date: 01/30/2024  Subjective:    Chief Complaint  Patient presents with   Annual Exam   Andrea [name] is a 38 y.o. female who presents today for a complete physical exam. She reports consuming a general diet.  No exercise regimen due to recent childbirth  She generally feels well. She reports sleeping fairly well. She does have additional problems to discuss today.  Vision:Yes Dental:Yes STD Screen:No  BP Readings from Last 3 Encounters:  01/30/24 110/74  09/21/23 99/65  07/09/23 112/62   Wt Readings from Last 3 Encounters:  01/30/24 184 lb 9.6 oz (83.7 kg)  09/21/23 191 lb 3.2 oz (86.7 kg)  07/09/23 176 lb (79.8 kg)   Most recent fall risk assessment:    01/30/2024   10:46 AM  Fall Risk   Falls in the past year? 0  Number falls in past yr: 0  Injury with Fall? 0  Risk for fall due to : No Fall Risks  Follow up Falls evaluation completed;Falls prevention discussed   Depression screen:Yes - No Depression  Most recent depression screenings:    01/30/2024   10:47 AM 09/21/2023    2:14 PM  PHQ 2/9 Scores  PHQ - 2 Score 0 0  PHQ- 9 Score 1    HPI  External hemorrhoid No improvement with OVER THE COUNTER topical agents. No constipation or diarrhea Worse with recent childbirth 2months ago, no rectal pain, has intermittent blood on stool.  Proctosol supp sent  Past Medical History:  Diagnosis Date   Constipation 08/16/2018   External hemorrhoids without complication 08/16/2018   Headache(784.0)    Infertility, anovulation 05/21/2017   Ovarian mass, right 05/21/2017   Uterine contractions during pregnancy 01/24/2018   Uterine size date discrepancy pregnancy, second trimester 10/30/2017   Past Surgical History:  Procedure Laterality Date   NO PAST SURGERIES     Social History   Socioeconomic History   Marital status: Married    Spouse name: Not on  file   Number of children: 2   Years of education: Not on file   Highest education level: Not on file  Occupational History    Comment: housewife  Tobacco Use   Smoking status: Never   Smokeless tobacco: Never  Vaping Use   Vaping status: Never Used  Substance and Sexual Activity   Alcohol use: No   Drug use: No   Sexual activity: Yes    Birth control/protection: Condom  Other Topics Concern   Not on file  Social History Narrative   Not on file   Social Drivers of Health   Financial Resource Strain: Low Risk  (01/21/2024)   Received from [facility]   Overall Financial Resource Strain (CARDIA)    Difficulty of Paying Living Expenses: Not hard at all  Food Insecurity: No Food Insecurity (01/21/2024)   Received from [facility]   Hunger Vital Sign    Worried About Running Out of Food in the Last Year: Never true    Ran Out of Food in the Last Year: Never true  Transportation Needs: No Transportation Needs (01/21/2024)   Received from [facility] - Transportation    Lack of Transportation (Medical): No    Lack of Transportation (Non-Medical): No  Physical Activity: Not on file  Stress: No Stress Concern Present (12/14/2023)   Received from [facility]  [employer] of Occupational Health - Occupational Stress Questionnaire    Feeling of Stress : Not at all  Social Connections: Unknown (12/14/2023)   Received from [facility]   Social Connection and Isolation Panel [NHANES]    Frequency of Communication with Friends and Family: More than three times a week    Frequency of Social Gatherings with Friends and Family: Once a week    Attends Religious Services: Not on Marketing executive or Organizations: Not on file    Attends Banker Meetings: Not on file    Marital Status: Not on file  Intimate Partner Violence: Not At Risk (11/19/2023)   Received from [facility]   Humiliation, Afraid, Rape, and Kick questionnaire    Fear  of Current or Ex-Partner: No    Emotionally Abused: No    Physically Abused: No    Sexually Abused: No   Family Status  Relation Name Status   Mother  Alive  No partnership data on file   Family History  Problem Relation Age of Onset   Hepatitis C Mother    Allergies  Allergen Reactions   Almond (Diagnostic) Anaphylaxis    Tougue swelling and mouth itching   Pecan Pollen Other (See Comments)    Patient Care Team: Torsha Lemus, Bonna Gains, NP as PCP - General (Internal Medicine) Ob/Gyn, Nestor Ramp   Medications: Outpatient Medications Prior to Visit  Medication Sig   acetaminophen (TYLENOL) 500 MG tablet Take 500 mg by mouth every 6 (six) hours as needed for mild pain (pain score 1-3) or moderate pain (pain score 4-6).   EPINEPHrine 0.3 mg/0.3 mL IJ SOAJ injection Inject 0.3 mg into the muscle as needed for anaphylaxis.   [DISCONTINUED] cetirizine (ZYRTEC) 10 MG tablet Take 1 tablet (10 mg total) by mouth daily. (Patient not taking: Reported on 01/30/2024)   [DISCONTINUED] fluticasone (FLONASE) 50 MCG/ACT nasal spray Place 2 sprays into both nostrils daily. (Patient not taking: Reported on 01/30/2024)   No facility-administered medications prior to visit.    Review of Systems  Constitutional:  Negative for activity change, appetite change and unexpected weight change.  Respiratory: Negative.    Cardiovascular: Negative.   Gastrointestinal: Negative.        External hemorrhoid since childbirth 2months ago  Endocrine: Negative for cold intolerance and heat intolerance.  Genitourinary: Negative.   Musculoskeletal: Negative.   Skin: Negative.   Neurological: Negative.   Hematological: Negative.   Psychiatric/Behavioral:  Negative for behavioral problems, decreased concentration, dysphoric mood, hallucinations, self-injury, sleep disturbance and suicidal ideas. The patient is not nervous/anxious.        Objective:  BP 110/74 (BP Location: Left Arm, Patient Position: Sitting)    Pulse 78   Temp 98 F (36.7 C) (Temporal)   Ht 5' (1.524 m)   Wt 184 lb 9.6 oz (83.7 kg)   SpO2 99%   Breastfeeding Yes Comment: Breast and bottle feeding  BMI 36.05 kg/m     Physical Exam Vitals and nursing note reviewed.  Constitutional:      General: [name] not in acute distress. HENT:     Right Ear: Tympanic membrane, ear canal and external ear normal.     Left Ear: Tympanic membrane, ear canal and external ear normal.     Nose: Nose normal.  Eyes:     Extraocular Movements: Extraocular movements intact.     Conjunctiva/sclera: Conjunctivae normal.     Pupils: Pupils are equal, round, and reactive  to light.  Neck:     Thyroid: No thyroid mass, thyromegaly or thyroid tenderness.  Cardiovascular:     Rate and Rhythm: Normal rate and regular rhythm.     Pulses: Normal pulses.     Heart sounds: Normal heart sounds.  Pulmonary:     Effort: Pulmonary effort is normal.     Breath sounds: Normal breath sounds.  Abdominal:     General: Bowel sounds are normal.     Palpations: Abdomen is soft.  Musculoskeletal:        General: Normal range of motion.     Cervical back: Normal range of motion and neck supple.     Right lower leg: No edema.     Left lower leg: No edema.  Lymphadenopathy:     Cervical: No cervical adenopathy.  Skin:    General: Skin is warm and dry.  Neurological:     Mental Status: [name] alert and oriented to person, place, and time.     Cranial Nerves: No cranial nerve deficit.  Psychiatric:        Mood and Affect: Mood normal.        Behavior: Behavior normal.        Thought Content: Thought content normal.      No results found for any visits on 01/30/24.    Assessment & Plan:    Routine Health Maintenance and Physical Exam  Immunization History  Administered Date(s) Administered   Influenza,inj,Quad PF,6+ Mos 08/06/2017, 08/16/2018, 08/22/2019, 11/01/2020   Janssen (J&J) SARS-COV-2 Vaccination 05/01/2020   Tdap 03/04/2013, 10/30/2017,  10/05/2023   Health Maintenance  Topic Date Due   COVID-19 Vaccine (2 - 2024-25 season) 02/19/2024 (Originally 07/29/2023)   INFLUENZA VACCINE  02/25/2024 (Originally 06/28/2023)   Hepatitis C Screening  01/29/2025 (Originally 01/25/2005)   Cervical Cancer Screening (HPV/Pap Cotest)  10/19/2024   DTaP/Tdap/Td (4 - Td or Tdap) 10/04/2033   HIV Screening  Completed   HPV VACCINES  Aged Out   Discussed health benefits of physical activity, and encouraged her to engage in regular exercise appropriate for her age and condition.  Problem List Items Addressed This Visit     External hemorrhoid   No improvement with OVER THE COUNTER topical agents. No constipation or diarrhea Worse with recent childbirth 2months ago, no rectal pain, has intermittent blood on stool.  Proctosol supp sent      Relevant Medications   hydrocortisone (ANUSOL-HC) 25 MG suppository   Other Visit Diagnoses       Encounter for preventative adult health care exam with abnormal findings    -  Primary   Relevant Orders   CBC   TSH   Comprehensive metabolic panel      Return in about 1 year (around 01/29/2025) for CPE (fasting).     Alysia Penna, NP

## 2024-01-30 NOTE — Patient Instructions (Signed)
 Go to lab Maintain Heart healthy diet and daily exercise.  How to Increase Your Level of Physical Activity Getting regular physical activity is important for your overall health and well-being. Most people do not get enough exercise. There are easy ways to increase your level of physical activity, even if you have not been very active in the past or if you are just starting out. What are the benefits of physical activity? Physical activity has many short-term and long-term benefits. Being active on a regular basis can improve your physical and mental health as well as provide other benefits. Physical health benefits Helping you lose weight or maintain a healthy weight. Strengthening your muscles and bones. Reducing your risk of certain long-term (chronic) diseases, including heart disease, cancer, and diabetes. Being able to move around more easily and for longer periods of time without getting tired (increased endurance or stamina). Improving your ability to fight off illness (enhanced immunity). Being able to sleep better. Helping you stay healthy as you get older, including: Helping you stay mobile, or capable of walking and moving around. Preventing accidents, such as falls. Increasing life expectancy. Mental health benefits Boosting your mood and improving your self-esteem. Lowering your chance of having mental health problems, such as depression or anxiety. Helping you feel good about your body. Other benefits Finding new sources of fun and enjoyment. Meeting new people who share a common interest. Before you begin If you have a chronic illness or have not been active for a while, check with your health care provider about how to get started. Ask your health care provider what activities are safe for you. Start out slowly. Walking or doing some simple chair exercises is a good place to start, especially if you have not been active before or for a long time. Set goals that you can  work toward. Ask your health care provider how much exercise is best for you. In general, most adults should: Do moderate-intensity exercise for at least 150 minutes each week (30 minutes on most days of the week) or vigorous exercise for at least 75 minutes each week, or a combination of these. Moderate-intensity exercise can include walking at a quick pace, biking, yoga, water aerobics, or gardening. Vigorous exercise involves activities that take more effort, such as jogging or running, playing sports, swimming laps, or jumping rope. Do strength exercises on at least 2 days each week. This can include weight lifting, body weight exercises, and resistance-band exercises. How to be more physically active Make a plan  Try to find activities that you enjoy. You are more likely to commit to an exercise routine if it does not feel like a chore. If you have bone or joint problems, choose low-impact exercises, like walking or swimming. Use these tips for being successful with an exercise plan: Find a workout partner for accountability. Join a group or class, such as an aerobics class, cycling class, or sports team. Make family time active. Go for a walk, bike, or swim. Include a variety of exercises each week. Consider using a fitness tracker, such as a mobile phone app or a device worn like a watch, that will count the number of steps you take each day. Many people strive to reach 10,000 steps a day. Find ways to be active in your daily routines Besides your formal exercise plans, you can find ways to do physical activity during your daily routines, such as: Walking or biking to work or to the store. Taking the stairs instead  of the elevator. Parking farther away from the door at work or at the store. Planning walking meetings. Walking around while you are on the phone. Where to find more information Centers for Disease Control and Prevention: CampusCasting.com.pt President's Council on  Fitness, Sports & Nutrition: www.fitness.gov ChooseMyPlate: http://www.harvey.com/ Contact a health care provider if: You have headaches, muscle aches, or joint pain that is concerning. You feel dizzy or light-headed while exercising. You faint. You feel your heart skipping, racing, or fluttering. You have chest pain while exercising. Summary Exercise benefits your mind and body at any age, even if you are just starting out. If you have a chronic illness or have not been active for a while, check with your health care provider before increasing your physical activity. Choose activities that are safe and enjoyable for you. Ask your health care provider what activities are safe for you. Start slowly. Tell your health care provider if you have problems as you start to increase your activity level. This information is not intended to replace advice given to you by your health care provider. Make sure you discuss any questions you have with your health care provider. Document Revised: 03/11/2021 Document Reviewed: 03/11/2021 Elsevier Patient Education  2024 [employer].

## 2024-02-05 DIAGNOSIS — Z01812 Encounter for preprocedural laboratory examination: Secondary | ICD-10-CM | POA: Diagnosis not present

## 2024-02-05 DIAGNOSIS — Z30017 Encounter for initial prescription of implantable subdermal contraceptive: Secondary | ICD-10-CM | POA: Diagnosis not present

## 2024-02-07 ENCOUNTER — Other Ambulatory Visit: Payer: Self-pay | Admitting: Nurse Practitioner

## 2024-02-07 DIAGNOSIS — K644 Residual hemorrhoidal skin tags: Secondary | ICD-10-CM

## 2024-02-25 ENCOUNTER — Encounter: Payer: Self-pay | Admitting: Nurse Practitioner

## 2024-02-25 ENCOUNTER — Encounter: Payer: BC Managed Care – PPO | Admitting: Nurse Practitioner

## 2024-10-02 DIAGNOSIS — Z3046 Encounter for surveillance of implantable subdermal contraceptive: Secondary | ICD-10-CM | POA: Diagnosis not present

## 2024-10-02 DIAGNOSIS — Z309 Encounter for contraceptive management, unspecified: Secondary | ICD-10-CM | POA: Diagnosis not present

## 2025-02-02 ENCOUNTER — Encounter: Admitting: Nurse Practitioner
